# Patient Record
Sex: Female | Born: 1985 | Race: Black or African American | Hispanic: No | Marital: Single | Smoking: Never smoker
Health system: Southern US, Community
[De-identification: ages and names within clinical notes are randomized; demographics above are authoritative.]

## PROBLEM LIST (undated history)

## (undated) ENCOUNTER — Emergency Department (HOSPITAL_COMMUNITY): Admission: EM | Payer: No Typology Code available for payment source | Source: Home / Self Care

## (undated) DIAGNOSIS — T7840XA Allergy, unspecified, initial encounter: Secondary | ICD-10-CM

## (undated) DIAGNOSIS — E119 Type 2 diabetes mellitus without complications: Secondary | ICD-10-CM

## (undated) DIAGNOSIS — I10 Essential (primary) hypertension: Secondary | ICD-10-CM

## (undated) DIAGNOSIS — J45909 Unspecified asthma, uncomplicated: Secondary | ICD-10-CM

---

## 2014-08-08 ENCOUNTER — Emergency Department (HOSPITAL_COMMUNITY): Payer: Medicare (Managed Care)

## 2014-08-08 ENCOUNTER — Encounter (HOSPITAL_COMMUNITY): Payer: Self-pay | Admitting: Radiology

## 2014-08-08 ENCOUNTER — Inpatient Hospital Stay (HOSPITAL_COMMUNITY)
Admission: EM | Admit: 2014-08-08 | Discharge: 2014-08-15 | DRG: 552 | Disposition: A | Discharge status: Home / Self Care | Payer: Medicare (Managed Care) | Attending: General Surgery | Admitting: General Surgery

## 2014-08-08 DIAGNOSIS — S12110A Anterior displaced Type II dens fracture, initial encounter for closed fracture: Principal | ICD-10-CM | POA: Diagnosis present

## 2014-08-08 DIAGNOSIS — S129XXA Fracture of neck, unspecified, initial encounter: Secondary | ICD-10-CM

## 2014-08-08 DIAGNOSIS — S12491A Other nondisplaced fracture of fifth cervical vertebra, initial encounter for closed fracture: Secondary | ICD-10-CM | POA: Diagnosis present

## 2014-08-08 DIAGNOSIS — S12100A Unspecified displaced fracture of second cervical vertebra, initial encounter for closed fracture: Secondary | ICD-10-CM

## 2014-08-08 DIAGNOSIS — F10120 Alcohol abuse with intoxication, uncomplicated: Secondary | ICD-10-CM | POA: Diagnosis present

## 2014-08-08 DIAGNOSIS — E876 Hypokalemia: Secondary | ICD-10-CM | POA: Diagnosis present

## 2014-08-08 DIAGNOSIS — S12291A Other nondisplaced fracture of third cervical vertebra, initial encounter for closed fracture: Secondary | ICD-10-CM | POA: Diagnosis present

## 2014-08-08 DIAGNOSIS — S12490A Other displaced fracture of fifth cervical vertebra, initial encounter for closed fracture: Secondary | ICD-10-CM | POA: Diagnosis present

## 2014-08-08 DIAGNOSIS — M62838 Other muscle spasm: Secondary | ICD-10-CM | POA: Diagnosis not present

## 2014-08-08 DIAGNOSIS — Y908 Blood alcohol level of 240 mg/100 ml or more: Secondary | ICD-10-CM | POA: Diagnosis present

## 2014-08-08 DIAGNOSIS — R1110 Vomiting, unspecified: Secondary | ICD-10-CM

## 2014-08-08 DIAGNOSIS — R112 Nausea with vomiting, unspecified: Secondary | ICD-10-CM | POA: Diagnosis not present

## 2014-08-08 DIAGNOSIS — F10929 Alcohol use, unspecified with intoxication, unspecified: Secondary | ICD-10-CM | POA: Diagnosis present

## 2014-08-08 HISTORY — DX: Essential (primary) hypertension: I10

## 2014-08-08 HISTORY — DX: Unspecified asthma, uncomplicated: J45.909

## 2014-08-08 LAB — TYPE AND SCREEN
ABO/RH(D): O POS
Antibody Screen: NEGATIVE
Unit division: 0
Unit division: 0

## 2014-08-08 LAB — CBC
HEMATOCRIT: 39.1 % (ref 36.0–46.0)
HEMOGLOBIN: 13.6 g/dL (ref 12.0–15.0)
MCH: 30.8 pg (ref 26.0–34.0)
MCHC: 34.8 g/dL (ref 30.0–36.0)
MCV: 88.7 fL (ref 78.0–100.0)
Platelets: 267 10*3/uL (ref 150–400)
RBC: 4.41 MIL/uL (ref 3.87–5.11)
RDW: 12.8 % (ref 11.5–15.5)
WBC: 12.8 10*3/uL — AB (ref 4.0–10.5)

## 2014-08-08 LAB — PROTIME-INR
INR: 1.12 (ref 0.00–1.49)
Prothrombin Time: 14.5 seconds (ref 11.6–15.2)

## 2014-08-08 LAB — CDS SEROLOGY

## 2014-08-08 MED ORDER — ONDANSETRON HCL 4 MG/2ML IJ SOLN
4.0000 mg | Freq: Once | INTRAMUSCULAR | Status: AC
  Administered 2014-08-08: 4 mg via INTRAVENOUS

## 2014-08-08 NOTE — ED Notes (Signed)
Patient with episode of vomiting x 1

## 2014-08-08 NOTE — ED Notes (Signed)
Patient attempting to get out of bed. Patient is crying and appears uncomfortable. She is not speaking with us but is moving all extremities.
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
## 2014-08-08 NOTE — ED Notes (Signed)
Patient to CT with RN

## 2014-08-08 NOTE — ED Notes (Signed)
Patient stating she needs to pee. Patient placed on bed. Will transport to CT after bedpan usage.

## 2014-08-09 ENCOUNTER — Encounter (HOSPITAL_COMMUNITY): Payer: Self-pay | Admitting: Surgery

## 2014-08-09 DIAGNOSIS — F10129 Alcohol abuse with intoxication, unspecified: Secondary | ICD-10-CM | POA: Diagnosis not present

## 2014-08-09 DIAGNOSIS — S12201A Unspecified nondisplaced fracture of third cervical vertebra, initial encounter for closed fracture: Secondary | ICD-10-CM | POA: Diagnosis not present

## 2014-08-09 DIAGNOSIS — F10120 Alcohol abuse with intoxication, uncomplicated: Secondary | ICD-10-CM | POA: Diagnosis present

## 2014-08-09 DIAGNOSIS — Y92488 Other paved roadways as the place of occurrence of the external cause: Secondary | ICD-10-CM | POA: Diagnosis not present

## 2014-08-09 DIAGNOSIS — S12110A Anterior displaced Type II dens fracture, initial encounter for closed fracture: Secondary | ICD-10-CM | POA: Diagnosis present

## 2014-08-09 DIAGNOSIS — S12291A Other nondisplaced fracture of third cervical vertebra, initial encounter for closed fracture: Secondary | ICD-10-CM | POA: Diagnosis present

## 2014-08-09 DIAGNOSIS — R112 Nausea with vomiting, unspecified: Secondary | ICD-10-CM | POA: Diagnosis not present

## 2014-08-09 DIAGNOSIS — Y998 Other external cause status: Secondary | ICD-10-CM | POA: Diagnosis not present

## 2014-08-09 DIAGNOSIS — F10929 Alcohol use, unspecified with intoxication, unspecified: Secondary | ICD-10-CM | POA: Diagnosis present

## 2014-08-09 DIAGNOSIS — M542 Cervicalgia: Secondary | ICD-10-CM | POA: Diagnosis present

## 2014-08-09 DIAGNOSIS — Y9389 Activity, other specified: Secondary | ICD-10-CM | POA: Diagnosis not present

## 2014-08-09 DIAGNOSIS — M62838 Other muscle spasm: Secondary | ICD-10-CM | POA: Diagnosis not present

## 2014-08-09 DIAGNOSIS — E876 Hypokalemia: Secondary | ICD-10-CM | POA: Diagnosis present

## 2014-08-09 DIAGNOSIS — S12491A Other nondisplaced fracture of fifth cervical vertebra, initial encounter for closed fracture: Secondary | ICD-10-CM | POA: Diagnosis present

## 2014-08-09 DIAGNOSIS — S12401A Unspecified nondisplaced fracture of fifth cervical vertebra, initial encounter for closed fracture: Secondary | ICD-10-CM | POA: Diagnosis not present

## 2014-08-09 DIAGNOSIS — K5900 Constipation, unspecified: Secondary | ICD-10-CM | POA: Diagnosis not present

## 2014-08-09 DIAGNOSIS — Z880 Allergy status to penicillin: Secondary | ICD-10-CM | POA: Diagnosis not present

## 2014-08-09 DIAGNOSIS — Z91018 Allergy to other foods: Secondary | ICD-10-CM | POA: Diagnosis not present

## 2014-08-09 DIAGNOSIS — Y908 Blood alcohol level of 240 mg/100 ml or more: Secondary | ICD-10-CM | POA: Diagnosis present

## 2014-08-09 LAB — BASIC METABOLIC PANEL
ANION GAP: 13 (ref 5–15)
BUN: 6 mg/dL (ref 6–23)
CHLORIDE: 106 mmol/L (ref 96–112)
CO2: 19 mmol/L (ref 19–32)
CREATININE: 0.77 mg/dL (ref 0.50–1.10)
Calcium: 9 mg/dL (ref 8.4–10.5)
GFR calc Af Amer: >90 mL/min (ref >90)
GLUCOSE: 97 mg/dL (ref 70–99)
POTASSIUM: 3.3 mmol/L — AB (ref 3.5–5.1)
Sodium: 138 mmol/L (ref 135–145)

## 2014-08-09 LAB — COMPREHENSIVE METABOLIC PANEL
ALK PHOS: 53 U/L (ref 39–117)
ALT: 21 U/L (ref 0–35)
ANION GAP: 12 (ref 5–15)
AST: 34 U/L (ref 0–37)
Albumin: 4.5 g/dL (ref 3.5–5.2)
BUN: 8 mg/dL (ref 6–23)
CALCIUM: 9.1 mg/dL (ref 8.4–10.5)
CHLORIDE: 105 mmol/L (ref 96–112)
CO2: 21 mmol/L (ref 19–32)
CREATININE: 0.99 mg/dL (ref 0.50–1.10)
GFR calc non Af Amer: 77 mL/min — ABNORMAL LOW (ref >90)
GFR, EST AFRICAN AMERICAN: 90 mL/min — AB (ref >90)
GLUCOSE: 116 mg/dL — AB (ref 70–99)
POTASSIUM: 2.7 mmol/L — AB (ref 3.5–5.1)
SODIUM: 138 mmol/L (ref 135–145)
TOTAL PROTEIN: 7.5 g/dL (ref 6.0–8.3)
Total Bilirubin: 0.7 mg/dL (ref 0.3–1.2)

## 2014-08-09 LAB — PREPARE FRESH FROZEN PLASMA
UNIT DIVISION: 0
Unit division: 0

## 2014-08-09 LAB — CBC
HCT: 38.1 % (ref 36.0–46.0)
Hemoglobin: 13.2 g/dL (ref 12.0–15.0)
MCH: 30.6 pg (ref 26.0–34.0)
MCHC: 34.6 g/dL (ref 30.0–36.0)
MCV: 88.4 fL (ref 78.0–100.0)
PLATELETS: 236 10*3/uL (ref 150–400)
RBC: 4.31 MIL/uL (ref 3.87–5.11)
RDW: 12.8 % (ref 11.5–15.5)
WBC: 11.8 10*3/uL — ABNORMAL HIGH (ref 4.0–10.5)

## 2014-08-09 LAB — ETHANOL: Alcohol, Ethyl (B): 243 mg/dL — ABNORMAL HIGH (ref 0–9)

## 2014-08-09 LAB — MRSA PCR SCREENING: MRSA by PCR: NEGATIVE

## 2014-08-09 LAB — ABO/RH: ABO/RH(D): O POS

## 2014-08-09 MED ORDER — MORPHINE SULFATE 2 MG/ML IJ SOLN
1.0000 mg | INTRAMUSCULAR | Status: DC | PRN
  Administered 2014-08-09: 4 mg via INTRAVENOUS
  Administered 2014-08-09 (×3): 2 mg via INTRAVENOUS
  Administered 2014-08-09: 4 mg via INTRAVENOUS
  Administered 2014-08-09: 2 mg via INTRAVENOUS
  Administered 2014-08-10 (×2): 4 mg via INTRAVENOUS
  Filled 2014-08-09: qty 1
  Filled 2014-08-09 (×2): qty 2
  Filled 2014-08-09 (×2): qty 1
  Filled 2014-08-09 (×2): qty 2
  Filled 2014-08-09 (×2): qty 1

## 2014-08-09 MED ORDER — ONDANSETRON HCL 4 MG/2ML IJ SOLN
4.0000 mg | Freq: Four times a day (QID) | INTRAMUSCULAR | Status: DC | PRN
  Administered 2014-08-12 – 2014-08-14 (×5): 4 mg via INTRAVENOUS
  Filled 2014-08-09 (×6): qty 2

## 2014-08-09 MED ORDER — PANTOPRAZOLE SODIUM 40 MG IV SOLR
40.0000 mg | Freq: Every day | INTRAVENOUS | Status: DC
  Administered 2014-08-09 (×2): 40 mg via INTRAVENOUS
  Filled 2014-08-09 (×3): qty 40

## 2014-08-09 MED ORDER — FOLIC ACID 1 MG PO TABS
1.0000 mg | ORAL_TABLET | Freq: Every day | ORAL | Status: DC
  Administered 2014-08-10 – 2014-08-15 (×6): 1 mg via ORAL
  Filled 2014-08-09 (×7): qty 1

## 2014-08-09 MED ORDER — ADULT MULTIVITAMIN W/MINERALS CH
1.0000 | ORAL_TABLET | Freq: Every day | ORAL | Status: DC
  Administered 2014-08-10 – 2014-08-15 (×6): 1 via ORAL
  Filled 2014-08-09 (×7): qty 1

## 2014-08-09 MED ORDER — LORAZEPAM 1 MG PO TABS
0.0000 mg | ORAL_TABLET | Freq: Two times a day (BID) | ORAL | Status: AC
  Administered 2014-08-11: 4 mg via ORAL
  Administered 2014-08-12: 3 mg via ORAL
  Administered 2014-08-12: 4 mg via ORAL
  Administered 2014-08-12: 1 mg via ORAL
  Filled 2014-08-09: qty 4
  Filled 2014-08-09: qty 3
  Filled 2014-08-09: qty 1
  Filled 2014-08-09: qty 4
  Filled 2014-08-09: qty 1
  Filled 2014-08-09: qty 4

## 2014-08-09 MED ORDER — ALBUTEROL SULFATE (2.5 MG/3ML) 0.083% IN NEBU: 3.0000 mL | INHALATION_SOLUTION | Freq: Four times a day (QID) | RESPIRATORY_TRACT | Status: DC | PRN

## 2014-08-09 MED ORDER — LORAZEPAM 1 MG PO TABS
0.0000 mg | ORAL_TABLET | Freq: Four times a day (QID) | ORAL | Status: AC
  Administered 2014-08-10 (×2): 1 mg via ORAL
  Filled 2014-08-09 (×4): qty 1

## 2014-08-09 MED ORDER — LORAZEPAM 1 MG PO TABS
1.0000 mg | ORAL_TABLET | Freq: Four times a day (QID) | ORAL | Status: AC | PRN
  Administered 2014-08-09 – 2014-08-12 (×6): 1 mg via ORAL
  Filled 2014-08-09 (×2): qty 1

## 2014-08-09 MED ORDER — VITAMIN B-1 100 MG PO TABS
100.0000 mg | ORAL_TABLET | Freq: Every day | ORAL | Status: DC
  Administered 2014-08-10 – 2014-08-15 (×6): 100 mg via ORAL
  Filled 2014-08-09 (×7): qty 1

## 2014-08-09 MED ORDER — OXYCODONE HCL 5 MG PO TABS
5.0000 mg | ORAL_TABLET | ORAL | Status: DC | PRN
  Administered 2014-08-09: 5 mg via ORAL
  Administered 2014-08-09 – 2014-08-10 (×3): 10 mg via ORAL
  Filled 2014-08-09 (×3): qty 2
  Filled 2014-08-09: qty 1

## 2014-08-09 MED ORDER — THIAMINE HCL 100 MG/ML IJ SOLN
100.0000 mg | Freq: Every day | INTRAMUSCULAR | Status: DC
  Filled 2014-08-09: qty 1

## 2014-08-09 MED ORDER — IOHEXOL 300 MG/ML  SOLN
100.0000 mL | Freq: Once | INTRAMUSCULAR | Status: AC | PRN
  Administered 2014-08-09: 100 mL via INTRAVENOUS

## 2014-08-09 MED ORDER — TIZANIDINE HCL 2 MG PO TABS
4.0000 mg | ORAL_TABLET | Freq: Three times a day (TID) | ORAL | Status: DC | PRN
Start: 2014-08-09 — End: 2014-08-12
  Administered 2014-08-09 – 2014-08-11 (×5): 4 mg via ORAL
  Filled 2014-08-09 (×2): qty 2
  Filled 2014-08-09: qty 1
  Filled 2014-08-09 (×2): qty 2
  Filled 2014-08-09: qty 1

## 2014-08-09 MED ORDER — FENTANYL CITRATE 0.05 MG/ML IJ SOLN
50.0000 ug | Freq: Once | INTRAMUSCULAR | Status: AC
  Administered 2014-08-09: 50 ug via INTRAVENOUS
  Filled 2014-08-09: qty 2

## 2014-08-09 MED ORDER — LORAZEPAM 2 MG/ML IJ SOLN: 1.0000 mg | Freq: Four times a day (QID) | INTRAMUSCULAR | Status: AC | PRN

## 2014-08-09 MED ORDER — ONDANSETRON HCL 4 MG/2ML IJ SOLN
4.0000 mg | Freq: Once | INTRAMUSCULAR | Status: AC
  Administered 2014-08-09: 4 mg via INTRAVENOUS
  Filled 2014-08-09: qty 2

## 2014-08-09 MED ORDER — KCL IN DEXTROSE-NACL 20-5-0.45 MEQ/L-%-% IV SOLN
INTRAVENOUS | Status: DC
  Administered 2014-08-09: 02:00:00 via INTRAVENOUS
  Filled 2014-08-09 (×5): qty 1000

## 2014-08-09 MED ORDER — PROMETHAZINE HCL 25 MG/ML IJ SOLN
12.5000 mg | INTRAMUSCULAR | Status: DC | PRN
  Administered 2014-08-09 – 2014-08-15 (×5): 12.5 mg via INTRAVENOUS
  Filled 2014-08-09 (×5): qty 1

## 2014-08-09 NOTE — Progress Notes (Signed)
Patient ID: Traci MinorsKeyoka Towry, female   DOB: 08/21/1985, 29 y.o.   MRN: [id number] Note dictated. Fracture of C2 type 2 with no displacement. Fracture of transverse process and tru foramen below. No displacement. Neuro oriented. He will need the hard collar 24/7 for at least 6 to 8 weeks. Will follow

## 2014-08-09 NOTE — ED Notes (Signed)
Patient transported to CT 
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
## 2014-08-09 NOTE — Progress Notes (Signed)
Patient ID: Traci Novak, female   DOB: 11/30/1985, 29 y.o.   MRN: [id number]  Pt now awake and follow commands, moving all 4 extremities  CT head, chest, abd, pelvis negative for acute injury  CT c-spine shows type 2 dens fracture, minimally displaced and left C3 transverse process fracture and a left C5 pedicle fracture  Will consult neurosurgery
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
## 2014-08-09 NOTE — Progress Notes (Signed)
Patient becoming combative and verbally abusive towards staff. Complaining she cannot go to the bathroom despite having foley catheter. Foley contained 300cc clear, yellow urine. Patient bladder scanned and found to have 34cc retained. Patient assured by RN and other staff that foley was draining bladder. Patient threatened staff that she would "pull it out" and "leave the hospital." RN removed foley catheter to calm patient. Patient was placed on the bedpan. Voided 10cc of urine, stated that she "was relieved." Will continue to monitor.   Rochele PagesHancock, Woodward Klem A, RN
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
## 2014-08-09 NOTE — Progress Notes (Signed)
UR completed 

## 2014-08-10 DIAGNOSIS — S12490A Other displaced fracture of fifth cervical vertebra, initial encounter for closed fracture: Secondary | ICD-10-CM | POA: Diagnosis present

## 2014-08-10 DIAGNOSIS — S12100A Unspecified displaced fracture of second cervical vertebra, initial encounter for closed fracture: Secondary | ICD-10-CM | POA: Diagnosis present

## 2014-08-10 DIAGNOSIS — S129XXA Fracture of neck, unspecified, initial encounter: Secondary | ICD-10-CM | POA: Diagnosis present

## 2014-08-10 MED ORDER — NAPROXEN 250 MG PO TABS
500.0000 mg | ORAL_TABLET | Freq: Two times a day (BID) | ORAL | Status: DC
  Administered 2014-08-10 – 2014-08-13 (×4): 500 mg via ORAL
  Filled 2014-08-10 (×6): qty 2

## 2014-08-10 MED ORDER — OXYCODONE HCL 5 MG PO TABS
10.0000 mg | ORAL_TABLET | Freq: Once | ORAL | Status: AC
  Administered 2014-08-10: 10 mg via ORAL
  Filled 2014-08-10: qty 2

## 2014-08-10 MED ORDER — MORPHINE SULFATE 2 MG/ML IJ SOLN
2.0000 mg | INTRAMUSCULAR | Status: DC | PRN
  Administered 2014-08-11 – 2014-08-14 (×4): 2 mg via INTRAVENOUS
  Filled 2014-08-10 (×4): qty 1

## 2014-08-10 MED ORDER — OXYCODONE HCL 5 MG PO TABS
10.0000 mg | ORAL_TABLET | ORAL | Status: DC | PRN
  Administered 2014-08-10: 10 mg via ORAL
  Administered 2014-08-10: 20 mg via ORAL
  Administered 2014-08-10: 10 mg via ORAL
  Administered 2014-08-11 – 2014-08-13 (×11): 20 mg via ORAL
  Administered 2014-08-13: 10 mg via ORAL
  Administered 2014-08-14 (×2): 20 mg via ORAL
  Administered 2014-08-15: 15 mg via ORAL
  Administered 2014-08-15: 20 mg via ORAL
  Filled 2014-08-10 (×8): qty 4
  Filled 2014-08-10: qty 2
  Filled 2014-08-10 (×2): qty 4
  Filled 2014-08-10 (×2): qty 2
  Filled 2014-08-10 (×6): qty 4

## 2014-08-10 NOTE — Progress Notes (Signed)
0600 Patient was ask if she needed to go the bathroom and she stated no. I explain that she had not voided in about 12hrs. Patient stated she knew when she had to go and would call. Patient refuses for us do any scan or cath.
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
## 2014-08-10 NOTE — Progress Notes (Signed)
PT Cancellation Note  Patient Details Name: Traci Novak MRN: [id number] DOB: 03/25/1986   Cancelled Treatment:    Reason Eval/Treat Not Completed: Other (comment). Patient extremely frustrated at this time. Stating that shes upset she can't get out of bed on her own and is in a ton of pain. Agreeable to therapy later this afternoon. Will follow up./    Traci Novak, Traci Novak 08/10/2014, 10:52 AM

## 2014-08-10 NOTE — Progress Notes (Signed)
Patient ID: Traci Novak Perfect, female   DOB: 09/11/1985, 29 y.o.   MRN: [id number] Neuro stable. No weaknss. C/o pain all over. Continue with brace

## 2014-08-11 ENCOUNTER — Inpatient Hospital Stay (HOSPITAL_COMMUNITY): Payer: Medicare (Managed Care)

## 2014-08-11 MED ORDER — WHITE PETROLATUM GEL
Status: AC
  Administered 2014-08-11: 07:00:00
  Filled 2014-08-11: qty 1

## 2014-08-11 MED ORDER — DOCUSATE SODIUM 100 MG PO CAPS
100.0000 mg | ORAL_CAPSULE | Freq: Two times a day (BID) | ORAL | Status: DC
  Administered 2014-08-11 – 2014-08-15 (×7): 100 mg via ORAL
  Filled 2014-08-11 (×8): qty 1

## 2014-08-11 MED ORDER — TRAMADOL HCL 50 MG PO TABS
100.0000 mg | ORAL_TABLET | Freq: Four times a day (QID) | ORAL | Status: DC
  Administered 2014-08-11 – 2014-08-15 (×17): 100 mg via ORAL
  Filled 2014-08-11 (×17): qty 2

## 2014-08-11 MED ORDER — POLYETHYLENE GLYCOL 3350 17 G PO PACK
17.0000 g | PACK | Freq: Every day | ORAL | Status: DC
  Administered 2014-08-11 – 2014-08-13 (×3): 17 g via ORAL
  Filled 2014-08-11 (×3): qty 1
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
## 2014-08-11 NOTE — Progress Notes (Signed)
Patient ID: Traci Novak, female   DOB: 11/13/1985, 29 y.o.   MRN: [id number] C/o pain in shoulders and neck. Neuro stable. To get a c-spine ray

## 2014-08-12 ENCOUNTER — Telehealth: Payer: Self-pay | Admitting: *Deleted

## 2014-08-12 LAB — BLOOD PRODUCT ORDER (VERBAL) VERIFICATION

## 2014-08-12 MED ORDER — MORPHINE SULFATE ER 15 MG PO TBCR
15.0000 mg | EXTENDED_RELEASE_TABLET | Freq: Two times a day (BID) | ORAL | Status: DC
  Administered 2014-08-12 – 2014-08-13 (×4): 15 mg via ORAL
  Filled 2014-08-12 (×4): qty 1

## 2014-08-12 MED ORDER — METHOCARBAMOL 750 MG PO TABS
1500.0000 mg | ORAL_TABLET | Freq: Four times a day (QID) | ORAL | Status: DC
  Administered 2014-08-12 – 2014-08-15 (×11): 1500 mg via ORAL
  Filled 2014-08-12 (×13): qty 2
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
## 2014-08-12 NOTE — Progress Notes (Signed)
Patient ID: [name], female   DOB: 07/17/1985, 29 y.o.   MRN: [id number] Neck pain, no weakness. c spine shows good aligment with increase of prevertebral swelling. Aware about the need to wear the collar 24/7 for 6 to 8 weeks
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
## 2014-08-12 NOTE — Clinical Social Work Note (Signed)
CSW attempted to assess patient and complete SBIRT. Patient has been non-cooperative and has tried to leave AMA. RN advises against meeting with patient today. CSW will attempt to reassess at a later time.    Roddie McBryant Aliyana Dlugosz MSW, [place]CSWA, [place], [phone]
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
## 2014-08-12 NOTE — Care Management Note (Signed)
  Page 1 of 1   08/12/2014     2:42:50 PM CARE MANAGEMENT NOTE 08/12/2014  Patient:  Traci Novak,Traci   Account Number:  [id number]  Date Initiated:  08/12/2014  Documentation initiated by:  Ronny FlurryWILE,Courtland Coppa  Subjective/Objective Assessment:     Action/Plan:   Anticipated DC Date:     Anticipated DC Plan:           Choice offered to / List presented to:             Status of service:   Medicare Important Message given?   (If response is "NO", the following Medicare IM given date fields will be blank) Date Medicare IM given:   Medicare IM given by:   Date Additional Medicare IM given:   Additional Medicare IM given by:    Discharge Disposition:    Per UR Regulation:    If discussed at Long Length of Stay Meetings, dates discussed:    Comments:  08-12-14 Received voice mail from Case Manager [facility] [phone] ext [phone] . Returned call left message . Spoke   to patient she does have insurance . Called admiting and faxed copy of front and back insurance card to admitting . returned insurance card to patient . Ronny FlurryHeather Jaiyon Wander RN BSN

## 2014-08-13 MED ORDER — SODIUM CHLORIDE 0.9 % IV SOLN
INTRAVENOUS | Status: DC
  Administered 2014-08-13 – 2014-08-14 (×3): via INTRAVENOUS
  Filled 2014-08-13 (×5): qty 1000

## 2014-08-13 MED ORDER — POTASSIUM CHLORIDE 10 MEQ/100ML IV SOLN
10.0000 meq | INTRAVENOUS | Status: AC
  Administered 2014-08-13 (×2): 10 meq via INTRAVENOUS
  Filled 2014-08-13 (×2): qty 100

## 2014-08-13 MED ORDER — SODIUM CHLORIDE 0.9 % IV SOLN
INTRAVENOUS | Status: AC
  Administered 2014-08-13: 07:00:00 via INTRAVENOUS

## 2014-08-13 MED ORDER — POLYETHYLENE GLYCOL 3350 17 G PO PACK
17.0000 g | PACK | Freq: Three times a day (TID) | ORAL | Status: DC
  Administered 2014-08-14 (×2): 17 g via ORAL
  Filled 2014-08-13 (×5): qty 1

## 2014-08-13 MED ORDER — HALOPERIDOL LACTATE 5 MG/ML IJ SOLN: 5.0000 mg | Freq: Once | INTRAMUSCULAR | Status: DC

## 2014-08-13 NOTE — Progress Notes (Signed)
Patient ID: Traci Novak, female   DOB: 03/02/1986, 29 y.o.   MRN: [id number] Stable . C/o neck pain. Advised about keeping collar 24/7
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
## 2014-08-13 NOTE — Progress Notes (Signed)
Patient very uncooperative and agitated.  She removed [employer] Lifespen Collar, wants to leave and go outside because"my friend upset me", refused Potassium IV .  Was able to put collar back after 10 mins.  Given PRN meds for nausea and pain.  MD notified.  Will discontinue K runs. Also patient requested to have her Depo provera shot for this month ( confirmed with [facility]  Phone no. [phone] Ext [phone] her dose and the last administration May 24, 2014) MD also notified.
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
## 2014-08-14 LAB — BASIC METABOLIC PANEL
Anion gap: 8 (ref 5–15)
BUN: 8 mg/dL (ref 6–23)
CO2: 27 mmol/L (ref 19–32)
Calcium: 9.5 mg/dL (ref 8.4–10.5)
Chloride: 102 mmol/L (ref 96–112)
Creatinine, Ser: 0.93 mg/dL (ref 0.50–1.10)
GFR calc Af Amer: >90 mL/min (ref >90)
GFR, EST NON AFRICAN AMERICAN: 83 mL/min — AB (ref >90)
Glucose, Bld: 68 mg/dL — ABNORMAL LOW (ref 70–99)
Potassium: 4.2 mmol/L (ref 3.5–5.1)
Sodium: 137 mmol/L (ref 135–145)

## 2014-08-14 MED ORDER — MEDROXYPROGESTERONE ACETATE 150 MG/ML IM SUSP
150.0000 mg | Freq: Once | INTRAMUSCULAR | Status: AC
  Administered 2014-08-14: 150 mg via INTRAMUSCULAR
  Filled 2014-08-14: qty 1

## 2014-08-14 MED ORDER — LORAZEPAM 2 MG/ML IJ SOLN
2.0000 mg | Freq: Four times a day (QID) | INTRAMUSCULAR | Status: DC | PRN
  Administered 2014-08-15: 2 mg via INTRAVENOUS
  Filled 2014-08-14: qty 1

## 2014-08-14 MED ORDER — FENTANYL 50 MCG/HR TD PT72
75.0000 ug | MEDICATED_PATCH | TRANSDERMAL | Status: DC
  Administered 2014-08-14: 75 ug via TRANSDERMAL
  Filled 2014-08-14 (×2): qty 1

## 2014-08-14 MED ORDER — FENTANYL CITRATE 0.05 MG/ML IJ SOLN
50.0000 ug | INTRAMUSCULAR | Status: DC | PRN
  Administered 2014-08-14 – 2014-08-15 (×6): 50 ug via INTRAVENOUS
  Filled 2014-08-14 (×7): qty 2

## 2014-08-14 NOTE — Progress Notes (Signed)
Patient out in hall yelling and complaining that she has been nauseated and vomiting since she's been here and she wants it to stop.  Pt says emesis is green and she states she is not passing gas. Refusing Zofran and Phenergan at this time; states she wants the MD called .  Patient agitated and says the doctor needs to make a plan to stop the vomiting.  Dr. Janee Mornhompson notified.  Will continue to encourage patient to take the nausea meds.

## 2014-08-14 NOTE — Progress Notes (Signed)
Told patient that Dr. Janee Mornhompson advised she take the nausea medications ordered.  Patient wants doctor to call her phone.  Says she's been taking medicine and nothing's worked and she still hasn't had a bowel movement.Wants to know why we are giving her all this medicine on an empty stomach.  Both Zofran and Phenergan are ordered IV; I informed the patient of this and told her this is the treatment we have for nausea.  She said, "well give me whatever the doctor said to give me!  I told her that the doctor advised she take the phenergan. She yelled,"Phenergan is for my pain! Ya'll just want to knock me out."  I told her that phenergan was for nausea.  She ran out of the room and said, "I want my nurse changed." Charge nurse informed.
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
## 2014-08-14 NOTE — Progress Notes (Signed)
Patient ID: Traci MinorsKeyoka Zeisler, female   DOB: 10/03/1985, 29 y.o.   MRN: [id number] Neuro stable. Refusing to wear collar. Advised about the need to wear it 24/7 to to allow fracture to heal WITHOUT surgery. Will f/u prn

## 2014-08-14 NOTE — Progress Notes (Deleted)
Patient out in hall yelling that she has been na

## 2014-08-15 ENCOUNTER — Encounter (HOSPITAL_COMMUNITY): Payer: Self-pay | Admitting: Emergency Medicine

## 2014-08-15 ENCOUNTER — Inpatient Hospital Stay (HOSPITAL_COMMUNITY): Payer: Medicare (Managed Care)

## 2014-08-15 ENCOUNTER — Observation Stay (HOSPITAL_COMMUNITY)
Admission: EM | Admit: 2014-08-15 | Discharge: 2014-08-16 | Disposition: A | Discharge status: Home / Self Care | Payer: Medicaid - Out of State | Attending: Internal Medicine | Admitting: Internal Medicine

## 2014-08-15 DIAGNOSIS — F10129 Alcohol abuse with intoxication, unspecified: Secondary | ICD-10-CM | POA: Insufficient documentation

## 2014-08-15 DIAGNOSIS — R52 Pain, unspecified: Secondary | ICD-10-CM | POA: Diagnosis present

## 2014-08-15 DIAGNOSIS — Z91018 Allergy to other foods: Secondary | ICD-10-CM | POA: Insufficient documentation

## 2014-08-15 DIAGNOSIS — S12201A Unspecified nondisplaced fracture of third cervical vertebra, initial encounter for closed fracture: Principal | ICD-10-CM | POA: Insufficient documentation

## 2014-08-15 DIAGNOSIS — K5900 Constipation, unspecified: Secondary | ICD-10-CM | POA: Insufficient documentation

## 2014-08-15 DIAGNOSIS — Y92488 Other paved roadways as the place of occurrence of the external cause: Secondary | ICD-10-CM | POA: Insufficient documentation

## 2014-08-15 DIAGNOSIS — Z880 Allergy status to penicillin: Secondary | ICD-10-CM | POA: Insufficient documentation

## 2014-08-15 DIAGNOSIS — S12110A Anterior displaced Type II dens fracture, initial encounter for closed fracture: Secondary | ICD-10-CM | POA: Insufficient documentation

## 2014-08-15 DIAGNOSIS — Y9389 Activity, other specified: Secondary | ICD-10-CM | POA: Insufficient documentation

## 2014-08-15 DIAGNOSIS — S129XXA Fracture of neck, unspecified, initial encounter: Secondary | ICD-10-CM | POA: Diagnosis present

## 2014-08-15 DIAGNOSIS — Y998 Other external cause status: Secondary | ICD-10-CM | POA: Insufficient documentation

## 2014-08-15 DIAGNOSIS — M542 Cervicalgia: Secondary | ICD-10-CM

## 2014-08-15 DIAGNOSIS — S12401A Unspecified nondisplaced fracture of fifth cervical vertebra, initial encounter for closed fracture: Secondary | ICD-10-CM | POA: Insufficient documentation

## 2014-08-15 MED ORDER — ONDANSETRON HCL 4 MG PO TABS
4.0000 mg | ORAL_TABLET | Freq: Three times a day (TID) | ORAL | Status: DC
  Filled 2014-08-15: qty 1

## 2014-08-15 MED ORDER — BISACODYL 10 MG RE SUPP
10.0000 mg | Freq: Once | RECTAL | Status: AC
  Administered 2014-08-15: 10 mg via RECTAL
  Filled 2014-08-15: qty 1

## 2014-08-15 MED ORDER — ONDANSETRON HCL 4 MG PO TABS: 4.0000 mg | ORAL_TABLET | Freq: Three times a day (TID) | ORAL | Status: DC | PRN

## 2014-08-15 MED ORDER — KETOROLAC TROMETHAMINE 30 MG/ML IJ SOLN
30.0000 mg | Freq: Once | INTRAMUSCULAR | Status: DC
  Filled 2014-08-15: qty 1

## 2014-08-15 MED ORDER — KETOROLAC TROMETHAMINE 15 MG/ML IJ SOLN: 15.0000 mg | Freq: Four times a day (QID) | INTRAMUSCULAR | Status: DC

## 2014-08-15 MED ORDER — ENOXAPARIN SODIUM 40 MG/0.4ML ~~LOC~~ SOLN
40.0000 mg | SUBCUTANEOUS | Status: DC
  Filled 2014-08-15: qty 0.4

## 2014-08-15 MED ORDER — TRAMADOL HCL 50 MG PO TABS: 100.0000 mg | ORAL_TABLET | Freq: Four times a day (QID) | ORAL | Status: DC

## 2014-08-15 MED ORDER — OXYCODONE-ACETAMINOPHEN 10-325 MG PO TABS: 1.0000 | ORAL_TABLET | ORAL | Status: DC | PRN

## 2014-08-15 MED ORDER — METHOCARBAMOL 500 MG PO TABS: 500.0000 mg | ORAL_TABLET | Freq: Four times a day (QID) | ORAL | Status: DC | PRN

## 2014-08-15 MED ORDER — OXYCODONE-ACETAMINOPHEN 5-325 MG PO TABS
2.0000 | ORAL_TABLET | ORAL | Status: DC | PRN
Start: 2014-08-15 — End: 2014-08-16

## 2014-08-15 MED ORDER — PROMETHAZINE HCL 25 MG PO TABS
12.5000 mg | ORAL_TABLET | Freq: Four times a day (QID) | ORAL | Status: DC | PRN
  Administered 2014-08-15: 25 mg via ORAL
  Filled 2014-08-15: qty 1

## 2014-08-15 MED ORDER — NAPROXEN 500 MG PO TABS: 500.0000 mg | ORAL_TABLET | Freq: Two times a day (BID) | ORAL | Status: DC
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
## 2014-08-15 NOTE — Progress Notes (Signed)
Patient has been discharged and refused to leave. Traci NeedleMichael, PA spoke with patient regarding discharge. Staff and the interdisciplinary team felt that patient was clear to be discharged based on patient status. PT cleared patient and had no recommendations for patient upon discharge. Patient had been getting up out of bed on her own walking in the hallways; also to the bathroom to bath self in sink. Patient has been disruptive to staff all day and complaining of still being in pain after pain medication was changed multiple times. Patient also had been complaining of nausea and vomiting stating that she hasn't been able to keep anything down since she's been here. Offered to give patient PO and IV nausea medication along with PO, IV pain med and patient agreed to it. Patient had loss of IV access and agreed to taking all medications at one time after new IV was restarted. Soon after, patient was told she was going to be discharged so IV was not restarted and patient did not receive scheduled Zofran. Patient became very upset and also refused pain medications at that time. Patient disagreed with orders stating that she wasn't going anywhere. Patient got furious with staff and asked to speak with the Director and attempted to call patient experience. Assistant Director Traci Novak tried to diffuse the situation with patient but patient kept getting louder and louder over talking using profanity towards staff. MD was notified. MD Lindie SpruceWyatt returned phone call and discontinued discharge orders for patient to stay another day. Patient felt like it was not worth it to stay one more day and decided to leave anyway. MD was notified. Patient left hospital shouting in hallway disrespecting staff using explicit and inappropriate language with also saying that everyone will be sorry that they discharged her. Security was called to escort patient for discharge.

## 2014-08-15 NOTE — Progress Notes (Signed)
PT Cancellation Note  Patient Details Name: Traci Novak MRN: [id number] DOB: 09/17/1985   Cancelled Treatment:    Reason Eval/Treat Not Completed: Other (comment) (pt actively being d/c from the hospital.  ).  IF she happens to still be here tomorrow PT to check back then.  Thanks,    Rollene Rotundaebecca B. Minnetta Sandora, PT, DPT [phone]   08/15/2014, 4:27 PM

## 2014-08-15 NOTE — Progress Notes (Signed)
Chart reviewed. CSW spoke with pt who does not believe that she should have been d/c'ed today in her condition.  She is unsure why she did not go to rehab before being d/c'ed today.  She describes extreme pain in her neck, along with a burning feeling that feels like someone is taking a "blowtorch" to her skin.  Pt also reports bloddy, watery stools today in ED.  Emotional support provided.  RNCM consulted and case discussed with PA.

## 2014-08-15 NOTE — Discharge Instructions (Signed)
Keep collar on at all times.  No driving while taking oxycodone.

## 2014-08-15 NOTE — Progress Notes (Signed)
Patient being discharge walked from 6N12 to the front desk verbally abusive , very mad and demanding to have a wheelchair to go home. Staff stated we could bring her down and she can ride in a wheelchair but she insist to have a wheelchair to go home with. Patient continously saying inappropriate words to staff, this writer called security but patient insisted to get a wheelchair Dan Humphreys/walker. This Clinical research associatewriter reviewed PT note and no equipment needed was documented for discharge. Explained to the patient , patient continously explicit inappropriate behavior, security escorted patient for discharge.
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
## 2014-08-15 NOTE — Progress Notes (Signed)
Pt been complaining of pain, nausea and vomiting through out the night; Pt was given IV phenergan for nausea and IV Fentanyl for pain; pt initially refusing any PO meds due to nausea; but would take tramadol for pain and would like to be waken up just for pain medication but not for any other scheduled meds. Pt gets agitated easily and offered Ativan but declined; Pt's VS stable; will continue to monitor pt.
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
## 2014-08-15 NOTE — Clinical Social Work Note (Signed)
Clinical Social Worker continuing to follow patient for support and discharge planning needs.  CSW witnessed patient get out of bed and walk across the hall to the RN to ask a question and back to bed.  CSW then spoke with patient in the room to discuss means of transportation at discharge.  Patient states that she will be able to find someone to transport her once ready for discharge.  Clinical Social Worker will sign off for now as social work intervention is no longer needed. Please consult us again if new need arises.  Macario GoldsJesse Allisyn Kunz, [place] [phone]
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
## 2014-08-16 DIAGNOSIS — R52 Pain, unspecified: Secondary | ICD-10-CM | POA: Diagnosis present

## 2014-08-16 DIAGNOSIS — M542 Cervicalgia: Secondary | ICD-10-CM

## 2014-08-16 DIAGNOSIS — S129XXA Fracture of neck, unspecified, initial encounter: Secondary | ICD-10-CM | POA: Diagnosis present

## 2014-08-16 DIAGNOSIS — S129XXD Fracture of neck, unspecified, subsequent encounter: Secondary | ICD-10-CM

## 2014-08-16 LAB — CBC WITH DIFFERENTIAL/PLATELET
Basophils Absolute: 0 10*3/uL (ref 0.0–0.1)
Basophils Relative: 0 % (ref 0–1)
Eosinophils Absolute: 0.2 10*3/uL (ref 0.0–0.7)
Eosinophils Relative: 2 % (ref 0–5)
HEMATOCRIT: 42.5 % (ref 36.0–46.0)
Hemoglobin: 14.2 g/dL (ref 12.0–15.0)
LYMPHS PCT: 28 % (ref 12–46)
Lymphs Abs: 2.5 10*3/uL (ref 0.7–4.0)
MCH: 29.7 pg (ref 26.0–34.0)
MCHC: 33.4 g/dL (ref 30.0–36.0)
MCV: 88.9 fL (ref 78.0–100.0)
MONO ABS: 0.6 10*3/uL (ref 0.1–1.0)
Monocytes Relative: 7 % (ref 3–12)
NEUTROS ABS: 5.7 10*3/uL (ref 1.7–7.7)
Neutrophils Relative %: 63 % (ref 43–77)
PLATELETS: 275 10*3/uL (ref 150–400)
RBC: 4.78 MIL/uL (ref 3.87–5.11)
RDW: 12.5 % (ref 11.5–15.5)
WBC: 9 10*3/uL (ref 4.0–10.5)

## 2014-08-16 LAB — RAPID URINE DRUG SCREEN, HOSP PERFORMED
Amphetamines: NOT DETECTED
Barbiturates: NOT DETECTED
Benzodiazepines: POSITIVE — AB
COCAINE: NOT DETECTED
Opiates: POSITIVE — AB
TETRAHYDROCANNABINOL: POSITIVE — AB

## 2014-08-16 LAB — COMPREHENSIVE METABOLIC PANEL
ALK PHOS: 52 U/L (ref 39–117)
ALT: 14 U/L (ref 0–35)
AST: 21 U/L (ref 0–37)
Albumin: 4.1 g/dL (ref 3.5–5.2)
Anion gap: 4 — ABNORMAL LOW (ref 5–15)
BILIRUBIN TOTAL: 0.5 mg/dL (ref 0.3–1.2)
BUN: 10 mg/dL (ref 6–23)
CHLORIDE: 104 mmol/L (ref 96–112)
CO2: 25 mmol/L (ref 19–32)
Calcium: 9.7 mg/dL (ref 8.4–10.5)
Creatinine, Ser: 0.97 mg/dL (ref 0.50–1.10)
GFR calc non Af Amer: 79 mL/min — ABNORMAL LOW (ref >90)
Glucose, Bld: 82 mg/dL (ref 70–99)
POTASSIUM: 4.4 mmol/L (ref 3.5–5.1)
Sodium: 133 mmol/L — ABNORMAL LOW (ref 135–145)
Total Protein: 6.9 g/dL (ref 6.0–8.3)

## 2014-08-16 LAB — ETHANOL: Alcohol, Ethyl (B): <5 mg/dL (ref 0–9)

## 2014-08-16 MED ORDER — METHOCARBAMOL 500 MG PO TABS: 500.0000 mg | ORAL_TABLET | Freq: Four times a day (QID) | ORAL | Status: DC | PRN

## 2014-08-16 MED ORDER — HEPARIN SODIUM (PORCINE) 5000 UNIT/ML IJ SOLN: 5000.0000 [IU] | Freq: Three times a day (TID) | INTRAMUSCULAR | Status: DC

## 2014-08-16 MED ORDER — HYDROCODONE-ACETAMINOPHEN 5-325 MG PO TABS
1.0000 | ORAL_TABLET | ORAL | Status: DC | PRN
  Administered 2014-08-16 (×3): 2 via ORAL
  Filled 2014-08-16 (×3): qty 2

## 2014-08-16 MED ORDER — SENNA 8.6 MG PO TABS
2.0000 | ORAL_TABLET | Freq: Every day | ORAL | Status: DC
Start: 2014-08-16 — End: 2014-08-16
  Administered 2014-08-16: 17.2 mg via ORAL
  Filled 2014-08-16: qty 2

## 2014-08-16 MED ORDER — METHOCARBAMOL 500 MG PO TABS
500.0000 mg | ORAL_TABLET | Freq: Four times a day (QID) | ORAL | Status: DC | PRN
  Administered 2014-08-16: 500 mg via ORAL
  Filled 2014-08-16: qty 1

## 2014-08-16 MED ORDER — POLYETHYLENE GLYCOL 3350 17 G PO PACK
17.0000 g | PACK | Freq: Two times a day (BID) | ORAL | Status: DC
  Administered 2014-08-16: 17 g via ORAL
  Filled 2014-08-16: qty 1

## 2014-08-16 MED ORDER — OXYCODONE-ACETAMINOPHEN 5-325 MG PO TABS: 2.0000 | ORAL_TABLET | ORAL | Status: DC | PRN

## 2014-08-16 MED ORDER — OXYCODONE-ACETAMINOPHEN 5-325 MG PO TABS
2.0000 | ORAL_TABLET | Freq: Once | ORAL | Status: AC
  Administered 2014-08-16: 2 via ORAL
  Filled 2014-08-16: qty 2
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
## 2014-08-16 NOTE — Progress Notes (Signed)
Patient up in room demanding in a firm agitated intense voice to get the "IV thing out of my arm, I'm taking a shower now"...while pacing the floor, only clothes on are her boxers. "I'm leaving here, take off these wrist things." Tech in room assisting. Attempted to explain to her SW is attempting to help with transportation and medications [Medicaid in [place] only, just recently moved to [place]]. "I don't want anything from anybody here at [facility], I'm leaving." Will notify MD of events. Will provide AMA form.

## 2014-08-16 NOTE — Progress Notes (Signed)
UR completed 

## 2014-08-16 NOTE — Clinical Social Work Note (Signed)
CSW met with patient at bedside with RNCM. 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 Patient currently with complaints of neck pain and inability to pay for pain medication at discharge. Patient requesting SNF placement, assistance with payment for pain medication at discharge, and assistance with transportation to [place] once discharged.  Patient able to get up from hospital bed without assistance and lift patient's duffle bag out of cabinet without any complaints or assistance.  CSW consulted with CSW Print production planner regarding patient's care. CSW returned to offer SNF placement within [place] or a bus pass to [place]. Patient refused both SNF placement and bus pass to [place], stating patient will be discharging home (one bedroom apartment in [place]) at time of discharge. Patient continuing to request assistance with payment for pain medication, stating "If I can just get my meds paid for I will be out of your hair." Patient reports patient has a friend to provide transportation home (apartment in [place]) on 08/16/2014. RNCM assisting with pain medication at this time.  Patient had just completed independently showering and currently standing in patient's room with no neck collar on, only wearing boxer and no shirt.  CSW receives a call from [facility], stating patient requesting a plane ticket to [place] rather than a buss pass. CSW spoke with patient and informed patient that patient has been medically cleared to transport via bus and will not be provided a plane ticket. Patient exclaims patient can "barely move my neck" and demanding to speak with MD prior to discharge. CSW informed patient that patient has been discharged and will need to decide if patient will discharge home to [place] apartment, bus pass to [place], or SNF. Patient refuses all three options stating patient is not medically stable for  discharge.  CSW consulted MD regarding patient's request to speak with MD. MD met with patient at discharge and still agreeable to discharge patient home.  Patient to discharge to friend's home at: [place], [place].  Lubertha Say[place], [place] Cell: [phone]       Fax: [phone] Clinical Social Work: Orthopedics [phone]) and Surgical [phone])
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
## 2016-07-22 ENCOUNTER — Emergency Department (HOSPITAL_COMMUNITY): Payer: Self-pay

## 2016-07-22 ENCOUNTER — Emergency Department (HOSPITAL_COMMUNITY)
Admission: EM | Admit: 2016-07-22 | Discharge: 2016-07-22 | Disposition: A | Discharge status: Home / Self Care | Payer: Self-pay | Attending: Physician Assistant | Admitting: Physician Assistant

## 2016-07-22 ENCOUNTER — Encounter (HOSPITAL_COMMUNITY): Payer: Self-pay | Admitting: *Deleted

## 2016-07-22 DIAGNOSIS — Y9289 Other specified places as the place of occurrence of the external cause: Secondary | ICD-10-CM | POA: Insufficient documentation

## 2016-07-22 DIAGNOSIS — J45909 Unspecified asthma, uncomplicated: Secondary | ICD-10-CM | POA: Insufficient documentation

## 2016-07-22 DIAGNOSIS — Y999 Unspecified external cause status: Secondary | ICD-10-CM | POA: Insufficient documentation

## 2016-07-22 DIAGNOSIS — I10 Essential (primary) hypertension: Secondary | ICD-10-CM | POA: Insufficient documentation

## 2016-07-22 DIAGNOSIS — S93401A Sprain of unspecified ligament of right ankle, initial encounter: Secondary | ICD-10-CM | POA: Insufficient documentation

## 2016-07-22 DIAGNOSIS — Z79899 Other long term (current) drug therapy: Secondary | ICD-10-CM | POA: Insufficient documentation

## 2016-07-22 DIAGNOSIS — Y9301 Activity, walking, marching and hiking: Secondary | ICD-10-CM | POA: Insufficient documentation

## 2016-07-22 DIAGNOSIS — S90111A Contusion of right great toe without damage to nail, initial encounter: Secondary | ICD-10-CM | POA: Insufficient documentation

## 2016-07-22 DIAGNOSIS — W010XXA Fall on same level from slipping, tripping and stumbling without subsequent striking against object, initial encounter: Secondary | ICD-10-CM | POA: Insufficient documentation

## 2016-07-22 MED ORDER — HYDROCODONE-ACETAMINOPHEN 5-325 MG PO TABS: 1.0000 | ORAL_TABLET | Freq: Four times a day (QID) | ORAL | 0 refills | Status: DC | PRN

## 2016-07-22 MED ORDER — HYDROCODONE-ACETAMINOPHEN 5-325 MG PO TABS
1.0000 | ORAL_TABLET | Freq: Once | ORAL | Status: AC
  Administered 2016-07-22: 1 via ORAL
  Filled 2016-07-22: qty 1

## 2016-07-22 MED ORDER — DICLOFENAC SODIUM 50 MG PO TBEC: 50.0000 mg | DELAYED_RELEASE_TABLET | Freq: Two times a day (BID) | ORAL | 0 refills | Status: DC
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
## 2016-07-22 NOTE — ED Notes (Signed)
See providers assessment.  

## 2016-07-22 NOTE — ED Triage Notes (Signed)
The pt fell just prior to arrival  He is c/o rt ankle pain with sl swelling

## 2016-07-22 NOTE — ED Notes (Signed)
Otho bedside  

## 2016-07-22 NOTE — Discharge Instructions (Signed)
Follow up with DR. Murphy for your ankle sprain. Return here as needed.

## 2016-07-22 NOTE — Progress Notes (Signed)
Orthopedic Tech Progress Note Patient Details:  Traci MinorsKeyoka Novak 02/04/1986 [id number]  Ortho Devices Type of Ortho Device: CAM walker, Crutches Ortho Device/Splint Location: Applied Cam Walker to pt Right ankle foot.  Pt tolerated well.  Provided and trained pt for crutches.  Pt used crutches previously.  (Right Foot/ankle) Ortho Device/Splint Interventions: Application, Adjustment   Alvina ChouWilliams, Keeleigh Terris C 07/22/2016, 10:18 PM

## 2016-08-31 ENCOUNTER — Encounter (HOSPITAL_COMMUNITY): Payer: Self-pay | Admitting: Emergency Medicine

## 2016-08-31 ENCOUNTER — Emergency Department (HOSPITAL_COMMUNITY)
Admission: EM | Admit: 2016-08-31 | Discharge: 2016-08-31 | Discharge status: Against Medical Advice | Payer: Medicaid - Out of State | Attending: Emergency Medicine | Admitting: Emergency Medicine

## 2016-08-31 ENCOUNTER — Emergency Department (HOSPITAL_COMMUNITY): Payer: Medicaid - Out of State

## 2016-08-31 DIAGNOSIS — W2105XA Struck by basketball, initial encounter: Secondary | ICD-10-CM | POA: Insufficient documentation

## 2016-08-31 DIAGNOSIS — Z79899 Other long term (current) drug therapy: Secondary | ICD-10-CM | POA: Insufficient documentation

## 2016-08-31 DIAGNOSIS — Y929 Unspecified place or not applicable: Secondary | ICD-10-CM | POA: Insufficient documentation

## 2016-08-31 DIAGNOSIS — Y939 Activity, unspecified: Secondary | ICD-10-CM | POA: Insufficient documentation

## 2016-08-31 DIAGNOSIS — I10 Essential (primary) hypertension: Secondary | ICD-10-CM | POA: Insufficient documentation

## 2016-08-31 DIAGNOSIS — S99921A Unspecified injury of right foot, initial encounter: Secondary | ICD-10-CM | POA: Insufficient documentation

## 2016-08-31 DIAGNOSIS — Y999 Unspecified external cause status: Secondary | ICD-10-CM | POA: Insufficient documentation

## 2016-08-31 DIAGNOSIS — S90211A Contusion of right great toe with damage to nail, initial encounter: Secondary | ICD-10-CM

## 2016-08-31 DIAGNOSIS — J45909 Unspecified asthma, uncomplicated: Secondary | ICD-10-CM | POA: Insufficient documentation

## 2016-08-31 MED ORDER — OXYCODONE-ACETAMINOPHEN 5-325 MG PO TABS
1.0000 | ORAL_TABLET | Freq: Once | ORAL | Status: AC
  Administered 2016-08-31: 1 via ORAL
  Filled 2016-08-31: qty 1

## 2016-08-31 NOTE — ED Triage Notes (Signed)
Pt st's she kicked a ball earlier today now has pain in right great toe.  St's she can't bend her toe

## 2016-08-31 NOTE — ED Notes (Signed)
ED Provider at bedside. 
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
## 2016-08-31 NOTE — ED Notes (Signed)
Gave pt.ice pack to apply to r foot

## 2016-08-31 NOTE — ED Notes (Signed)
Patient transported to X-ray 

## 2016-08-31 NOTE — ED Notes (Signed)
Pt states they are ready to leave and have been here too long. This RN advised I would make the doctor aware of pts concerns.

## 2016-08-31 NOTE — ED Notes (Signed)
Pt seen ambulating out of ED.

## 2016-09-13 ENCOUNTER — Emergency Department (HOSPITAL_COMMUNITY)
Admission: EM | Admit: 2016-09-13 | Discharge: 2016-09-14 | Disposition: A | Discharge status: Home / Self Care | Payer: Medicare (Managed Care) | Attending: Emergency Medicine | Admitting: Emergency Medicine

## 2016-09-13 ENCOUNTER — Encounter (HOSPITAL_COMMUNITY): Payer: Self-pay | Admitting: *Deleted

## 2016-09-13 DIAGNOSIS — R1110 Vomiting, unspecified: Secondary | ICD-10-CM | POA: Diagnosis not present

## 2016-09-13 DIAGNOSIS — Z5321 Procedure and treatment not carried out due to patient leaving prior to being seen by health care provider: Secondary | ICD-10-CM | POA: Diagnosis not present

## 2016-09-13 LAB — CBC
HCT: 36.8 % (ref 36.0–46.0)
HEMOGLOBIN: 12.4 g/dL (ref 12.0–15.0)
MCH: 30.5 pg (ref 26.0–34.0)
MCHC: 33.7 g/dL (ref 30.0–36.0)
MCV: 90.4 fL (ref 78.0–100.0)
Platelets: 237 10*3/uL (ref 150–400)
RBC: 4.07 MIL/uL (ref 3.87–5.11)
RDW: 12.9 % (ref 11.5–15.5)
WBC: 7.4 10*3/uL (ref 4.0–10.5)

## 2016-09-13 LAB — COMPREHENSIVE METABOLIC PANEL
ALK PHOS: 44 U/L (ref 38–126)
ALT: 25 U/L (ref 14–54)
AST: 23 U/L (ref 15–41)
Albumin: 4.3 g/dL (ref 3.5–5.0)
Anion gap: 9 (ref 5–15)
BUN: 8 mg/dL (ref 6–20)
CO2: 25 mmol/L (ref 22–32)
CREATININE: 0.94 mg/dL (ref 0.44–1.00)
Calcium: 9.7 mg/dL (ref 8.9–10.3)
Chloride: 103 mmol/L (ref 101–111)
GFR calc Af Amer: >60 mL/min (ref >60)
GFR calc non Af Amer: >60 mL/min (ref >60)
GLUCOSE: 87 mg/dL (ref 65–99)
Potassium: 3.6 mmol/L (ref 3.5–5.1)
SODIUM: 137 mmol/L (ref 135–145)
Total Bilirubin: 0.5 mg/dL (ref 0.3–1.2)
Total Protein: 7 g/dL (ref 6.5–8.1)

## 2016-09-13 LAB — URINALYSIS, ROUTINE W REFLEX MICROSCOPIC
Bilirubin Urine: NEGATIVE
Glucose, UA: NEGATIVE mg/dL
Hgb urine dipstick: NEGATIVE
Ketones, ur: NEGATIVE mg/dL
LEUKOCYTES UA: NEGATIVE
NITRITE: NEGATIVE
Protein, ur: NEGATIVE mg/dL
SPECIFIC GRAVITY, URINE: 1.026 (ref 1.005–1.030)
pH: 6 (ref 5.0–8.0)

## 2016-09-13 LAB — LIPASE, BLOOD: Lipase: 40 U/L (ref 11–51)

## 2016-09-13 MED ORDER — ONDANSETRON 4 MG PO TBDP
8.0000 mg | ORAL_TABLET | Freq: Once | ORAL | Status: AC
  Administered 2016-09-13: 8 mg via ORAL
  Filled 2016-09-13: qty 2

## 2016-09-13 NOTE — ED Triage Notes (Signed)
The pt reports he has had abd pain for 4 days with n and vomiting  He reports that he vomited blood yesterday  He has taken antiacids and that is not helping

## 2016-09-13 NOTE — ED Notes (Signed)
Pt called x2 no answer 

## 2016-09-13 NOTE — ED Notes (Signed)
No answer x2 

## 2016-09-13 NOTE — ED Triage Notes (Signed)
No bm for 2 days

## 2016-09-16 ENCOUNTER — Encounter (HOSPITAL_COMMUNITY): Payer: Self-pay | Admitting: Emergency Medicine

## 2016-09-16 ENCOUNTER — Emergency Department (HOSPITAL_COMMUNITY): Payer: Medicare (Managed Care)

## 2016-09-16 ENCOUNTER — Emergency Department (HOSPITAL_COMMUNITY)
Admission: EM | Admit: 2016-09-16 | Discharge: 2016-09-16 | Disposition: A | Discharge status: Home / Self Care | Payer: Medicare (Managed Care) | Attending: Emergency Medicine | Admitting: Emergency Medicine

## 2016-09-16 DIAGNOSIS — I10 Essential (primary) hypertension: Secondary | ICD-10-CM | POA: Insufficient documentation

## 2016-09-16 DIAGNOSIS — J45909 Unspecified asthma, uncomplicated: Secondary | ICD-10-CM | POA: Diagnosis not present

## 2016-09-16 DIAGNOSIS — R1013 Epigastric pain: Secondary | ICD-10-CM | POA: Diagnosis present

## 2016-09-16 DIAGNOSIS — K2970 Gastritis, unspecified, without bleeding: Secondary | ICD-10-CM | POA: Diagnosis not present

## 2016-09-16 DIAGNOSIS — Z79899 Other long term (current) drug therapy: Secondary | ICD-10-CM | POA: Insufficient documentation

## 2016-09-16 LAB — COMPREHENSIVE METABOLIC PANEL
ALBUMIN: 4.7 g/dL (ref 3.5–5.0)
ALK PHOS: 42 U/L (ref 38–126)
ALT: 27 U/L (ref 14–54)
ANION GAP: 6 (ref 5–15)
AST: 24 U/L (ref 15–41)
BILIRUBIN TOTAL: 0.4 mg/dL (ref 0.3–1.2)
BUN: 10 mg/dL (ref 6–20)
CO2: 27 mmol/L (ref 22–32)
Calcium: 9.7 mg/dL (ref 8.9–10.3)
Chloride: 105 mmol/L (ref 101–111)
Creatinine, Ser: 0.98 mg/dL (ref 0.44–1.00)
GFR calc Af Amer: >60 mL/min (ref >60)
GFR calc non Af Amer: >60 mL/min (ref >60)
GLUCOSE: 92 mg/dL (ref 65–99)
Potassium: 4.3 mmol/L (ref 3.5–5.1)
Sodium: 138 mmol/L (ref 135–145)
TOTAL PROTEIN: 7.7 g/dL (ref 6.5–8.1)

## 2016-09-16 LAB — I-STAT CHEM 8, ED
BUN: 9 mg/dL (ref 6–20)
CALCIUM ION: 1.29 mmol/L (ref 1.15–1.40)
CREATININE: 1.1 mg/dL — AB (ref 0.44–1.00)
Chloride: 102 mmol/L (ref 101–111)
GLUCOSE: 87 mg/dL (ref 65–99)
HCT: 41 % (ref 36.0–46.0)
HEMOGLOBIN: 13.9 g/dL (ref 12.0–15.0)
Potassium: 4.3 mmol/L (ref 3.5–5.1)
Sodium: 138 mmol/L (ref 135–145)
TCO2: 26 mmol/L (ref 0–100)

## 2016-09-16 LAB — CBC
HCT: 39.7 % (ref 36.0–46.0)
Hemoglobin: 13.6 g/dL (ref 12.0–15.0)
MCH: 31.5 pg (ref 26.0–34.0)
MCHC: 34.3 g/dL (ref 30.0–36.0)
MCV: 91.9 fL (ref 78.0–100.0)
PLATELETS: 243 10*3/uL (ref 150–400)
RBC: 4.32 MIL/uL (ref 3.87–5.11)
RDW: 13 % (ref 11.5–15.5)
WBC: 5.3 10*3/uL (ref 4.0–10.5)

## 2016-09-16 LAB — TYPE AND SCREEN
ABO/RH(D): O POS
ANTIBODY SCREEN: NEGATIVE

## 2016-09-16 LAB — AMMONIA: Ammonia: 20 umol/L (ref 9–35)

## 2016-09-16 LAB — ETHANOL: Alcohol, Ethyl (B): <5 mg/dL (ref <5)

## 2016-09-16 LAB — I-STAT CG4 LACTIC ACID, ED: Lactic Acid, Venous: 0.62 mmol/L (ref 0.5–1.9)

## 2016-09-16 LAB — POC OCCULT BLOOD, ED: FECAL OCCULT BLD: NEGATIVE

## 2016-09-16 LAB — PROTIME-INR
INR: 1.02
Prothrombin Time: 13.4 seconds (ref 11.4–15.2)

## 2016-09-16 LAB — LIPASE, BLOOD: Lipase: 34 U/L (ref 11–51)

## 2016-09-16 LAB — ABO/RH: ABO/RH(D): O POS

## 2016-09-16 MED ORDER — TRAMADOL HCL 50 MG PO TABS: 50.0000 mg | ORAL_TABLET | Freq: Four times a day (QID) | ORAL | 0 refills | Status: DC | PRN

## 2016-09-16 MED ORDER — LIDOCAINE VISCOUS 2 % MT SOLN: 20.0000 mL | OROMUCOSAL | 0 refills | Status: AC | PRN

## 2016-09-16 MED ORDER — ONDANSETRON 4 MG PO TBDP
4.0000 mg | ORAL_TABLET | Freq: Once | ORAL | Status: AC
  Administered 2016-09-16: 4 mg via ORAL
  Filled 2016-09-16: qty 1

## 2016-09-16 MED ORDER — SUCRALFATE 1 G PO TABS: 1.0000 g | ORAL_TABLET | Freq: Three times a day (TID) | ORAL | 0 refills | Status: AC

## 2016-09-16 MED ORDER — DEXLANSOPRAZOLE 30 MG PO CPDR: 30.0000 mg | DELAYED_RELEASE_CAPSULE | Freq: Every day | ORAL | 2 refills | Status: AC

## 2016-09-16 MED ORDER — ONDANSETRON HCL 4 MG PO TABS: 4.0000 mg | ORAL_TABLET | Freq: Three times a day (TID) | ORAL | 0 refills | Status: AC | PRN

## 2016-09-16 MED ORDER — MORPHINE SULFATE (PF) 4 MG/ML IV SOLN
4.0000 mg | Freq: Once | INTRAVENOUS | Status: AC
  Administered 2016-09-16: 4 mg via INTRAVENOUS
  Filled 2016-09-16: qty 1

## 2016-09-16 MED ORDER — GI COCKTAIL ~~LOC~~
30.0000 mL | Freq: Once | ORAL | Status: AC
  Administered 2016-09-16: 30 mL via ORAL
  Filled 2016-09-16: qty 30
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
## 2016-09-16 NOTE — Discharge Instructions (Signed)
Follow these instructions at home: Take over-the-counter and prescription medicines only as told by your health care provider. If you were prescribed an antibiotic, take it as told by your health care provider. Do not stop taking the antibiotic even if you start to feel better. Drink enough fluid to keep your urine clear or pale yellow. Eat small, frequent meals instead of large meals. Contact a health care provider if: Your symptoms get worse. Your symptoms return after treatment. Get help right away if: You vomit blood or material that looks like coffee grounds. You have black or dark red stools. You are unable to keep fluids down. Your abdominal pain gets worse. You have a fever. You do not feel better after 1 week.

## 2016-09-16 NOTE — ED Triage Notes (Signed)
Pt verbalizes dark blood in stool onset yesterday; Pt verbalizes coffee ground vomiting 2-3 days ago but not today.

## 2016-11-14 DIAGNOSIS — Z79899 Other long term (current) drug therapy: Secondary | ICD-10-CM | POA: Insufficient documentation

## 2016-11-14 DIAGNOSIS — J45909 Unspecified asthma, uncomplicated: Secondary | ICD-10-CM | POA: Insufficient documentation

## 2016-11-14 DIAGNOSIS — K5900 Constipation, unspecified: Secondary | ICD-10-CM | POA: Insufficient documentation

## 2016-11-14 DIAGNOSIS — R319 Hematuria, unspecified: Secondary | ICD-10-CM | POA: Insufficient documentation

## 2016-11-14 DIAGNOSIS — I10 Essential (primary) hypertension: Secondary | ICD-10-CM | POA: Insufficient documentation

## 2016-11-15 ENCOUNTER — Emergency Department (HOSPITAL_COMMUNITY): Payer: Self-pay

## 2016-11-15 ENCOUNTER — Encounter (HOSPITAL_COMMUNITY): Payer: Self-pay

## 2016-11-15 ENCOUNTER — Emergency Department (HOSPITAL_COMMUNITY)
Admission: EM | Admit: 2016-11-15 | Discharge: 2016-11-15 | Disposition: A | Discharge status: Home / Self Care | Payer: Self-pay | Attending: Emergency Medicine | Admitting: Emergency Medicine

## 2016-11-15 DIAGNOSIS — K5900 Constipation, unspecified: Secondary | ICD-10-CM

## 2016-11-15 DIAGNOSIS — R319 Hematuria, unspecified: Secondary | ICD-10-CM

## 2016-11-15 LAB — URINALYSIS, ROUTINE W REFLEX MICROSCOPIC
BACTERIA UA: NONE SEEN
BILIRUBIN URINE: NEGATIVE
Glucose, UA: NEGATIVE mg/dL
KETONES UR: NEGATIVE mg/dL
LEUKOCYTES UA: NEGATIVE
Nitrite: NEGATIVE
PH: 5 (ref 5.0–8.0)
PROTEIN: NEGATIVE mg/dL
Specific Gravity, Urine: 1.026 (ref 1.005–1.030)

## 2016-11-15 LAB — GC/CHLAMYDIA PROBE AMP (~~LOC~~) NOT AT ARMC
CHLAMYDIA, DNA PROBE: NEGATIVE
Neisseria Gonorrhea: NEGATIVE

## 2016-11-15 LAB — WET PREP, GENITAL
SPERM: NONE SEEN
Trich, Wet Prep: NONE SEEN
YEAST WET PREP: NONE SEEN

## 2016-11-15 LAB — POC URINE PREG, ED: Preg Test, Ur: NEGATIVE

## 2016-11-15 MED ORDER — KETOROLAC TROMETHAMINE 30 MG/ML IJ SOLN
30.0000 mg | Freq: Once | INTRAMUSCULAR | Status: AC
  Administered 2016-11-15: 30 mg via INTRAVENOUS
  Filled 2016-11-15: qty 1

## 2016-11-15 MED ORDER — IBUPROFEN 600 MG PO TABS: 600.0000 mg | ORAL_TABLET | Freq: Four times a day (QID) | ORAL | 0 refills | Status: DC | PRN

## 2016-11-15 MED ORDER — POLYETHYLENE GLYCOL 3350 17 GM/SCOOP PO POWD: 17.0000 g | Freq: Two times a day (BID) | ORAL | 0 refills | Status: AC

## 2016-11-15 NOTE — ED Notes (Signed)
Bed: [id number] Expected date:  Expected time:  Means of arrival:  Comments: Hold-taking Room 2
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
## 2016-11-15 NOTE — Discharge Instructions (Signed)
Try to increase your daily dose of dietary fiber. Take MiraLAX as prescribed for constipation. It is also possible that you may have a small kidney stone not shown on CT causing your pain and blood in your urine. Take ibuprofen for continued pain, if it persists. Follow-up with a primary care doctor to ensure resolution of symptoms.

## 2016-11-15 NOTE — ED Triage Notes (Signed)
Pt complains of a pressure like vaginal pain

## 2016-11-15 NOTE — ED Notes (Signed)
Bed: WA06 Expected date:  Expected time:  Means of arrival:  Comments: 

## 2016-11-23 ENCOUNTER — Emergency Department (HOSPITAL_COMMUNITY): Payer: Medicare (Managed Care)

## 2016-11-23 ENCOUNTER — Emergency Department (HOSPITAL_COMMUNITY)
Admission: EM | Admit: 2016-11-23 | Discharge: 2016-11-23 | Disposition: A | Discharge status: Home / Self Care | Payer: Medicare (Managed Care) | Attending: Emergency Medicine | Admitting: Emergency Medicine

## 2016-11-23 ENCOUNTER — Encounter (HOSPITAL_COMMUNITY): Payer: Self-pay | Admitting: Emergency Medicine

## 2016-11-23 DIAGNOSIS — Y999 Unspecified external cause status: Secondary | ICD-10-CM | POA: Diagnosis not present

## 2016-11-23 DIAGNOSIS — R51 Headache: Secondary | ICD-10-CM | POA: Diagnosis not present

## 2016-11-23 DIAGNOSIS — Y92410 Unspecified street and highway as the place of occurrence of the external cause: Secondary | ICD-10-CM | POA: Insufficient documentation

## 2016-11-23 DIAGNOSIS — M791 Myalgia: Secondary | ICD-10-CM | POA: Insufficient documentation

## 2016-11-23 DIAGNOSIS — J45909 Unspecified asthma, uncomplicated: Secondary | ICD-10-CM | POA: Insufficient documentation

## 2016-11-23 DIAGNOSIS — I10 Essential (primary) hypertension: Secondary | ICD-10-CM | POA: Diagnosis not present

## 2016-11-23 DIAGNOSIS — Y9389 Activity, other specified: Secondary | ICD-10-CM | POA: Insufficient documentation

## 2016-11-23 DIAGNOSIS — E119 Type 2 diabetes mellitus without complications: Secondary | ICD-10-CM | POA: Diagnosis not present

## 2016-11-23 DIAGNOSIS — M542 Cervicalgia: Secondary | ICD-10-CM | POA: Diagnosis not present

## 2016-11-23 HISTORY — DX: Type 2 diabetes mellitus without complications: E11.9

## 2016-11-23 LAB — POC URINE PREG, ED: PREG TEST UR: NEGATIVE

## 2016-11-23 MED ORDER — MORPHINE SULFATE (PF) 4 MG/ML IV SOLN: 4.0000 mg | Freq: Once | INTRAVENOUS | Status: DC

## 2016-11-23 MED ORDER — MORPHINE SULFATE (PF) 4 MG/ML IV SOLN
4.0000 mg | Freq: Once | INTRAVENOUS | Status: AC
  Administered 2016-11-23: 4 mg via INTRAMUSCULAR
  Filled 2016-11-23: qty 1

## 2016-11-23 MED ORDER — ALBUTEROL SULFATE HFA 108 (90 BASE) MCG/ACT IN AERS: 1.0000 | INHALATION_SPRAY | Freq: Four times a day (QID) | RESPIRATORY_TRACT | 0 refills | Status: DC | PRN

## 2016-11-23 MED ORDER — ACETAMINOPHEN 500 MG PO TABS
1000.0000 mg | ORAL_TABLET | Freq: Once | ORAL | Status: AC
  Administered 2016-11-23: 1000 mg via ORAL
  Filled 2016-11-23: qty 2

## 2016-11-23 MED ORDER — METHOCARBAMOL 500 MG PO TABS: 500.0000 mg | ORAL_TABLET | Freq: Three times a day (TID) | ORAL | 0 refills | Status: AC | PRN
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
## 2016-11-26 ENCOUNTER — Emergency Department (HOSPITAL_COMMUNITY)
Admission: EM | Admit: 2016-11-26 | Discharge: 2016-11-27 | Disposition: A | Discharge status: Home / Self Care | Payer: Medicare (Managed Care) | Attending: Emergency Medicine | Admitting: Emergency Medicine

## 2016-11-26 DIAGNOSIS — S20211A Contusion of right front wall of thorax, initial encounter: Secondary | ICD-10-CM | POA: Diagnosis not present

## 2016-11-26 DIAGNOSIS — M545 Low back pain, unspecified: Secondary | ICD-10-CM

## 2016-11-26 DIAGNOSIS — Y999 Unspecified external cause status: Secondary | ICD-10-CM | POA: Insufficient documentation

## 2016-11-26 DIAGNOSIS — Y9389 Activity, other specified: Secondary | ICD-10-CM | POA: Diagnosis not present

## 2016-11-26 DIAGNOSIS — J45909 Unspecified asthma, uncomplicated: Secondary | ICD-10-CM | POA: Insufficient documentation

## 2016-11-26 DIAGNOSIS — R0781 Pleurodynia: Secondary | ICD-10-CM

## 2016-11-26 DIAGNOSIS — S299XXA Unspecified injury of thorax, initial encounter: Secondary | ICD-10-CM | POA: Diagnosis present

## 2016-11-26 DIAGNOSIS — I10 Essential (primary) hypertension: Secondary | ICD-10-CM | POA: Diagnosis not present

## 2016-11-26 DIAGNOSIS — Y92410 Unspecified street and highway as the place of occurrence of the external cause: Secondary | ICD-10-CM | POA: Insufficient documentation

## 2016-11-26 DIAGNOSIS — E119 Type 2 diabetes mellitus without complications: Secondary | ICD-10-CM | POA: Diagnosis not present

## 2016-11-26 MED ORDER — METHOCARBAMOL 500 MG PO TABS
1000.0000 mg | ORAL_TABLET | Freq: Once | ORAL | Status: AC
  Administered 2016-11-27: 1000 mg via ORAL
  Filled 2016-11-26: qty 2

## 2016-11-26 MED ORDER — IBUPROFEN 800 MG PO TABS
800.0000 mg | ORAL_TABLET | Freq: Once | ORAL | Status: AC
  Administered 2016-11-27: 800 mg via ORAL
  Filled 2016-11-26: qty 1
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
## 2016-11-27 ENCOUNTER — Emergency Department (HOSPITAL_COMMUNITY): Payer: Medicare (Managed Care)

## 2016-11-27 MED ORDER — ONDANSETRON 4 MG PO TBDP
4.0000 mg | ORAL_TABLET | Freq: Once | ORAL | Status: AC
  Administered 2016-11-27: 4 mg via ORAL
  Filled 2016-11-27: qty 1

## 2016-11-27 MED ORDER — IBUPROFEN 800 MG PO TABS: 800.0000 mg | ORAL_TABLET | Freq: Three times a day (TID) | ORAL | 0 refills | Status: AC

## 2016-11-27 MED ORDER — METHOCARBAMOL 500 MG PO TABS: 500.0000 mg | ORAL_TABLET | Freq: Three times a day (TID) | ORAL | 0 refills | Status: AC | PRN

## 2016-11-27 MED ORDER — TRAMADOL HCL 50 MG PO TABS: 50.0000 mg | ORAL_TABLET | Freq: Four times a day (QID) | ORAL | 0 refills | Status: AC | PRN

## 2016-11-27 NOTE — Discharge Instructions (Signed)
Ice to painful areas. Expect slow improvement of your symptoms over the next 7-10 days
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
## 2017-05-01 ENCOUNTER — Emergency Department (HOSPITAL_COMMUNITY)
Admission: EM | Admit: 2017-05-01 | Discharge: 2017-05-01 | Disposition: A | Discharge status: Home / Self Care | Payer: Medicaid Other | Attending: Emergency Medicine | Admitting: Emergency Medicine

## 2017-05-01 ENCOUNTER — Encounter (HOSPITAL_COMMUNITY): Payer: Self-pay | Admitting: Emergency Medicine

## 2017-05-01 ENCOUNTER — Other Ambulatory Visit: Payer: Self-pay

## 2017-05-01 ENCOUNTER — Emergency Department (HOSPITAL_COMMUNITY): Payer: Medicaid Other

## 2017-05-01 DIAGNOSIS — J4521 Mild intermittent asthma with (acute) exacerbation: Secondary | ICD-10-CM | POA: Insufficient documentation

## 2017-05-01 DIAGNOSIS — Z79899 Other long term (current) drug therapy: Secondary | ICD-10-CM | POA: Diagnosis not present

## 2017-05-01 DIAGNOSIS — E119 Type 2 diabetes mellitus without complications: Secondary | ICD-10-CM | POA: Diagnosis not present

## 2017-05-01 DIAGNOSIS — I10 Essential (primary) hypertension: Secondary | ICD-10-CM | POA: Diagnosis not present

## 2017-05-01 DIAGNOSIS — R0602 Shortness of breath: Secondary | ICD-10-CM | POA: Diagnosis present

## 2017-05-01 LAB — BASIC METABOLIC PANEL
Anion gap: 6 (ref 5–15)
BUN: 13 mg/dL (ref 6–20)
CALCIUM: 9.2 mg/dL (ref 8.9–10.3)
CO2: 24 mmol/L (ref 22–32)
CREATININE: 0.97 mg/dL (ref 0.44–1.00)
Chloride: 110 mmol/L (ref 101–111)
GLUCOSE: 90 mg/dL (ref 65–99)
Potassium: 3.8 mmol/L (ref 3.5–5.1)
Sodium: 140 mmol/L (ref 135–145)

## 2017-05-01 LAB — CBC
HCT: 36.8 % (ref 36.0–46.0)
Hemoglobin: 12.4 g/dL (ref 12.0–15.0)
MCH: 31 pg (ref 26.0–34.0)
MCHC: 33.7 g/dL (ref 30.0–36.0)
MCV: 92 fL (ref 78.0–100.0)
PLATELETS: 259 10*3/uL (ref 150–400)
RBC: 4 MIL/uL (ref 3.87–5.11)
RDW: 13.6 % (ref 11.5–15.5)
WBC: 6.8 10*3/uL (ref 4.0–10.5)

## 2017-05-01 LAB — I-STAT BETA HCG BLOOD, ED (MC, WL, AP ONLY): I-stat hCG, quantitative: <5 m[IU]/mL (ref <5)

## 2017-05-01 LAB — I-STAT TROPONIN, ED: TROPONIN I, POC: 0 ng/mL (ref 0.00–0.08)

## 2017-05-01 MED ORDER — PREDNISONE 20 MG PO TABS
60.0000 mg | ORAL_TABLET | Freq: Once | ORAL | Status: AC
  Administered 2017-05-01: 60 mg via ORAL
  Filled 2017-05-01: qty 3

## 2017-05-01 MED ORDER — PREDNISONE 20 MG PO TABS: 40.0000 mg | ORAL_TABLET | Freq: Every day | ORAL | 0 refills | Status: DC

## 2017-05-01 MED ORDER — ALBUTEROL SULFATE HFA 108 (90 BASE) MCG/ACT IN AERS
2.0000 | INHALATION_SPRAY | RESPIRATORY_TRACT | Status: DC | PRN
  Administered 2017-05-01: 2 via RESPIRATORY_TRACT
  Filled 2017-05-01: qty 6.7

## 2017-05-01 MED ORDER — ALBUTEROL SULFATE (2.5 MG/3ML) 0.083% IN NEBU
5.0000 mg | INHALATION_SOLUTION | Freq: Once | RESPIRATORY_TRACT | Status: AC
Start: 2017-05-01 — End: 2017-05-01
  Administered 2017-05-01: 5 mg via RESPIRATORY_TRACT

## 2017-05-01 MED ORDER — ALBUTEROL SULFATE HFA 108 (90 BASE) MCG/ACT IN AERS: 2.0000 | INHALATION_SPRAY | RESPIRATORY_TRACT | 2 refills | Status: AC | PRN

## 2017-05-01 NOTE — ED Triage Notes (Signed)
Pt c/o asthma exacerbation for the past few hours with 6/10 central cp, pt very labored breathing on triage.
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
## 2017-07-29 ENCOUNTER — Other Ambulatory Visit: Payer: Self-pay

## 2017-07-29 ENCOUNTER — Encounter (HOSPITAL_COMMUNITY): Payer: Self-pay | Admitting: Emergency Medicine

## 2017-07-29 DIAGNOSIS — J309 Allergic rhinitis, unspecified: Secondary | ICD-10-CM | POA: Insufficient documentation

## 2017-07-29 DIAGNOSIS — I10 Essential (primary) hypertension: Secondary | ICD-10-CM | POA: Insufficient documentation

## 2017-07-29 DIAGNOSIS — R0981 Nasal congestion: Secondary | ICD-10-CM | POA: Diagnosis present

## 2017-07-29 DIAGNOSIS — E119 Type 2 diabetes mellitus without complications: Secondary | ICD-10-CM | POA: Insufficient documentation

## 2017-07-29 DIAGNOSIS — J45909 Unspecified asthma, uncomplicated: Secondary | ICD-10-CM | POA: Diagnosis not present

## 2017-07-29 NOTE — ED Triage Notes (Signed)
Pt is c/o sinus pressure and pain  Pt states his ears feel clogged up  Pt states he has been taking benadryl without relief

## 2017-07-30 ENCOUNTER — Emergency Department (HOSPITAL_COMMUNITY)
Admission: EM | Admit: 2017-07-30 | Discharge: 2017-07-30 | Disposition: A | Discharge status: Home / Self Care | Payer: Medicaid Other | Attending: Emergency Medicine | Admitting: Emergency Medicine

## 2017-07-30 DIAGNOSIS — J309 Allergic rhinitis, unspecified: Secondary | ICD-10-CM

## 2017-07-30 MED ORDER — LORATADINE-PSEUDOEPHEDRINE ER 10-240 MG PO TB24: 1.0000 | ORAL_TABLET | Freq: Every day | ORAL | 0 refills | Status: AC

## 2017-07-30 MED ORDER — TRIAMCINOLONE ACETONIDE 55 MCG/ACT NA AERO: 2.0000 | INHALATION_SPRAY | Freq: Every day | NASAL | 12 refills | Status: AC

## 2017-07-30 NOTE — ED Notes (Signed)
This Clinical research associatewriter spoke with patient. Patient with loud pressured speech at this writer demanding to "see a doctor now". Patient states that he has been waiting and if he doesn't get seen, "I am calling my lawyer". Patient with strong arm movements at this writer with loud voice. Explained his triage acuity of 4 and explained triage process. Patient states he does not care. Attempted to give patient Patient Experience card and patient refused card.

## 2017-07-30 NOTE — ED Notes (Signed)
Pt refused repeat vital signs.

## 2017-07-30 NOTE — ED Notes (Signed)
Patient came to nurses station and asked if there was any "administration" here at this time. Writer informed patient there was no admin in the building at this time. Patient then asked for the "highest of the high in charge" to come to their room and said "I have been asking for them to come to my room for hours now". Charge RN made aware.

## 2017-07-30 NOTE — ED Notes (Signed)
Charge RN went to bedside to talk with patient about the problems they were experiencing during their visit. Patient got very loud with Charge RN and refused patient experience card that was given to them. Patient also stated they would be contacting their attorney.
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
## 2017-07-30 NOTE — ED Notes (Addendum)
When this nurse entered the room to greet and assess the pt, she became upset and started yelling about the wait time and asked when the provider would see her that she has to work in the morning. Pt stated that it is ridiculous that all the other patients were taken back before her that do not look sick. This nurse attempted to explain that we have to see people based on severity and we would try to see her as soon as possible. Pt states she doesn't care how we do things she needs to see someone now.

## 2017-08-30 ENCOUNTER — Encounter (HOSPITAL_COMMUNITY): Payer: Self-pay | Admitting: Emergency Medicine

## 2017-08-30 ENCOUNTER — Emergency Department (HOSPITAL_COMMUNITY)
Admission: EM | Admit: 2017-08-30 | Discharge: 2017-08-30 | Disposition: A | Discharge status: Home / Self Care | Payer: Medicaid Other | Attending: Emergency Medicine | Admitting: Emergency Medicine

## 2017-08-30 DIAGNOSIS — Z5321 Procedure and treatment not carried out due to patient leaving prior to being seen by health care provider: Secondary | ICD-10-CM | POA: Insufficient documentation

## 2017-08-30 DIAGNOSIS — J45901 Unspecified asthma with (acute) exacerbation: Secondary | ICD-10-CM | POA: Insufficient documentation

## 2017-08-30 MED ORDER — ALBUTEROL SULFATE (2.5 MG/3ML) 0.083% IN NEBU
INHALATION_SOLUTION | RESPIRATORY_TRACT | Status: AC
  Administered 2017-08-30: 5 mg via RESPIRATORY_TRACT
  Filled 2017-08-30: qty 6

## 2017-08-30 MED ORDER — ALBUTEROL SULFATE (2.5 MG/3ML) 0.083% IN NEBU
5.0000 mg | INHALATION_SOLUTION | Freq: Once | RESPIRATORY_TRACT | Status: AC
  Administered 2017-08-30: 5 mg via RESPIRATORY_TRACT

## 2017-08-30 NOTE — ED Notes (Signed)
Called Pt to take to room. No answer

## 2017-08-30 NOTE — ED Triage Notes (Signed)
Pt reports asthma exacerbation onset this AM. States allergies have been flaring up over the past two weeks.

## 2017-12-07 ENCOUNTER — Other Ambulatory Visit: Payer: Self-pay

## 2017-12-07 ENCOUNTER — Emergency Department (HOSPITAL_COMMUNITY)
Admission: EM | Admit: 2017-12-07 | Discharge: 2017-12-07 | Disposition: A | Discharge status: Home / Self Care | Payer: No Typology Code available for payment source | Attending: Emergency Medicine | Admitting: Emergency Medicine

## 2017-12-07 ENCOUNTER — Emergency Department (HOSPITAL_COMMUNITY): Payer: No Typology Code available for payment source

## 2017-12-07 ENCOUNTER — Encounter (HOSPITAL_COMMUNITY): Payer: Self-pay | Admitting: Emergency Medicine

## 2017-12-07 DIAGNOSIS — E119 Type 2 diabetes mellitus without complications: Secondary | ICD-10-CM | POA: Diagnosis not present

## 2017-12-07 DIAGNOSIS — J45909 Unspecified asthma, uncomplicated: Secondary | ICD-10-CM | POA: Insufficient documentation

## 2017-12-07 DIAGNOSIS — R1012 Left upper quadrant pain: Secondary | ICD-10-CM | POA: Insufficient documentation

## 2017-12-07 DIAGNOSIS — Z789 Other specified health status: Secondary | ICD-10-CM

## 2017-12-07 DIAGNOSIS — I10 Essential (primary) hypertension: Secondary | ICD-10-CM | POA: Diagnosis not present

## 2017-12-07 DIAGNOSIS — R079 Chest pain, unspecified: Secondary | ICD-10-CM | POA: Diagnosis not present

## 2017-12-07 DIAGNOSIS — Z7289 Other problems related to lifestyle: Secondary | ICD-10-CM

## 2017-12-07 DIAGNOSIS — F1099 Alcohol use, unspecified with unspecified alcohol-induced disorder: Secondary | ICD-10-CM | POA: Insufficient documentation

## 2017-12-07 DIAGNOSIS — R109 Unspecified abdominal pain: Secondary | ICD-10-CM

## 2017-12-07 DIAGNOSIS — R51 Headache: Secondary | ICD-10-CM | POA: Insufficient documentation

## 2017-12-07 DIAGNOSIS — Z79899 Other long term (current) drug therapy: Secondary | ICD-10-CM | POA: Diagnosis not present

## 2017-12-07 LAB — CBC
HCT: 43.5 % (ref 36.0–46.0)
HEMOGLOBIN: 14.5 g/dL (ref 12.0–15.0)
MCH: 30.4 pg (ref 26.0–34.0)
MCHC: 33.3 g/dL (ref 30.0–36.0)
MCV: 91.2 fL (ref 78.0–100.0)
Platelets: 354 10*3/uL (ref 150–400)
RBC: 4.77 MIL/uL (ref 3.87–5.11)
RDW: 12.8 % (ref 11.5–15.5)
WBC: 12.3 10*3/uL — ABNORMAL HIGH (ref 4.0–10.5)

## 2017-12-07 LAB — COMPREHENSIVE METABOLIC PANEL
ALBUMIN: 5 g/dL (ref 3.5–5.0)
ALT: 93 U/L — ABNORMAL HIGH (ref 0–44)
AST: 107 U/L — ABNORMAL HIGH (ref 15–41)
Alkaline Phosphatase: 51 U/L (ref 38–126)
Anion gap: 13 (ref 5–15)
BUN: 12 mg/dL (ref 6–20)
CHLORIDE: 104 mmol/L (ref 98–111)
CO2: 25 mmol/L (ref 22–32)
Calcium: 10 mg/dL (ref 8.9–10.3)
Creatinine, Ser: 1.05 mg/dL — ABNORMAL HIGH (ref 0.44–1.00)
GFR calc non Af Amer: >60 mL/min (ref >60)
Glucose, Bld: 88 mg/dL (ref 70–99)
POTASSIUM: 3.7 mmol/L (ref 3.5–5.1)
SODIUM: 142 mmol/L (ref 135–145)
Total Bilirubin: 0.6 mg/dL (ref 0.3–1.2)
Total Protein: 8.5 g/dL — ABNORMAL HIGH (ref 6.5–8.1)

## 2017-12-07 LAB — I-STAT TROPONIN, ED: Troponin i, poc: 0 ng/mL (ref 0.00–0.08)

## 2017-12-07 LAB — I-STAT BETA HCG BLOOD, ED (MC, WL, AP ONLY)

## 2017-12-07 LAB — ETHANOL: Alcohol, Ethyl (B): 48 mg/dL — ABNORMAL HIGH (ref <10)

## 2017-12-07 MED ORDER — ONDANSETRON HCL 4 MG/2ML IJ SOLN
4.0000 mg | Freq: Once | INTRAMUSCULAR | Status: AC
  Administered 2017-12-07: 4 mg via INTRAVENOUS
  Filled 2017-12-07: qty 2

## 2017-12-07 MED ORDER — IOHEXOL 300 MG/ML  SOLN
100.0000 mL | Freq: Once | INTRAMUSCULAR | Status: AC | PRN
  Administered 2017-12-07: 100 mL via INTRAVENOUS

## 2017-12-07 MED ORDER — SODIUM CHLORIDE 0.9 % IV BOLUS
500.0000 mL | Freq: Once | INTRAVENOUS | Status: AC
Start: 2017-12-07 — End: 2017-12-07
  Administered 2017-12-07: 500 mL via INTRAVENOUS

## 2017-12-07 MED ORDER — MORPHINE SULFATE (PF) 4 MG/ML IV SOLN
4.0000 mg | Freq: Once | INTRAVENOUS | Status: AC
  Administered 2017-12-07: 4 mg via INTRAVENOUS
  Filled 2017-12-07: qty 1
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
## 2017-12-07 NOTE — Discharge Instructions (Addendum)
Take tylenol and motrin as needed for pain. Do not drink alcohol and operate machinery as you could kill yourself or someone's family members.

## 2017-12-07 NOTE — ED Notes (Signed)
Patient transported to CT 

## 2017-12-07 NOTE — ED Notes (Signed)
Pt states " I really want to drink something; I feel like my mouth is just way to dry right now." Explained to patient that she is NPO, provided mouth moisturizer.

## 2018-01-21 ENCOUNTER — Other Ambulatory Visit: Payer: Self-pay

## 2018-03-13 ENCOUNTER — Emergency Department (HOSPITAL_COMMUNITY)
Admission: EM | Admit: 2018-03-13 | Discharge: 2018-03-13 | Disposition: A | Discharge status: Home / Self Care | Payer: Medicaid - Out of State | Attending: Emergency Medicine | Admitting: Emergency Medicine

## 2018-03-13 ENCOUNTER — Other Ambulatory Visit: Payer: Self-pay

## 2018-03-13 ENCOUNTER — Emergency Department (HOSPITAL_COMMUNITY): Payer: Medicaid - Out of State

## 2018-03-13 ENCOUNTER — Encounter (HOSPITAL_COMMUNITY): Payer: Self-pay | Admitting: Emergency Medicine

## 2018-03-13 DIAGNOSIS — E119 Type 2 diabetes mellitus without complications: Secondary | ICD-10-CM | POA: Insufficient documentation

## 2018-03-13 DIAGNOSIS — Y9289 Other specified places as the place of occurrence of the external cause: Secondary | ICD-10-CM | POA: Insufficient documentation

## 2018-03-13 DIAGNOSIS — Y998 Other external cause status: Secondary | ICD-10-CM | POA: Insufficient documentation

## 2018-03-13 DIAGNOSIS — S60222A Contusion of left hand, initial encounter: Secondary | ICD-10-CM | POA: Insufficient documentation

## 2018-03-13 DIAGNOSIS — Z79899 Other long term (current) drug therapy: Secondary | ICD-10-CM | POA: Insufficient documentation

## 2018-03-13 DIAGNOSIS — J45909 Unspecified asthma, uncomplicated: Secondary | ICD-10-CM | POA: Insufficient documentation

## 2018-03-13 DIAGNOSIS — I10 Essential (primary) hypertension: Secondary | ICD-10-CM | POA: Insufficient documentation

## 2018-03-13 DIAGNOSIS — Y9389 Activity, other specified: Secondary | ICD-10-CM | POA: Insufficient documentation

## 2018-03-13 LAB — POC URINE PREG, ED: Preg Test, Ur: NEGATIVE

## 2018-03-13 MED ORDER — ACETAMINOPHEN 500 MG PO TABS
1000.0000 mg | ORAL_TABLET | Freq: Once | ORAL | Status: AC
  Administered 2018-03-13: 1000 mg via ORAL
  Filled 2018-03-13: qty 2

## 2018-03-13 MED ORDER — SULFAMETHOXAZOLE-TRIMETHOPRIM 800-160 MG PO TABS: 1.0000 | ORAL_TABLET | Freq: Two times a day (BID) | ORAL | 0 refills | Status: AC

## 2018-03-13 MED ORDER — CLINDAMYCIN HCL 300 MG PO CAPS: 300.0000 mg | ORAL_CAPSULE | Freq: Three times a day (TID) | ORAL | 0 refills | Status: AC
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
## 2018-03-13 NOTE — Discharge Instructions (Addendum)
You can take Tylenol or Ibuprofen as directed for pain. You can alternate Tylenol and Ibuprofen every 4 hours. If you take Tylenol at 1pm, then you can take Ibuprofen at 5pm. Then you can take Tylenol again at 9pm.   Take antibiotics as directed. Please take all of your antibiotics until finished.  Follow the RICE (Rest, Ice, Compression, Elevation) protocol as directed.   Follow up with the [facility] clinic as needed. Follow with the referred hand doctor if your numbness does not get better.   Return to emergency department for any worsening pain, redness or swelling of the finger, and inability to move the finger, numbness/weakness does not improve, fever or any other worsening or concerning symptoms.
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
## 2018-06-07 ENCOUNTER — Emergency Department (HOSPITAL_COMMUNITY)
Admission: EM | Admit: 2018-06-07 | Discharge: 2018-06-07 | Disposition: A | Discharge status: Home / Self Care | Payer: Self-pay | Attending: Emergency Medicine | Admitting: Emergency Medicine

## 2018-06-07 DIAGNOSIS — Z5321 Procedure and treatment not carried out due to patient leaving prior to being seen by health care provider: Secondary | ICD-10-CM | POA: Insufficient documentation

## 2018-06-07 DIAGNOSIS — R0602 Shortness of breath: Secondary | ICD-10-CM | POA: Insufficient documentation

## 2018-06-07 NOTE — ED Notes (Signed)
Pt is not in [facility].

## 2018-06-07 NOTE — ED Notes (Signed)
Pt left Triage room after tech informing him he needed a urine specimen.  Pt stated "he needed to go to his car."

## 2018-08-03 ENCOUNTER — Encounter (HOSPITAL_COMMUNITY): Payer: Self-pay | Admitting: Emergency Medicine

## 2018-08-03 ENCOUNTER — Emergency Department (HOSPITAL_COMMUNITY)
Admission: EM | Admit: 2018-08-03 | Discharge: 2018-08-03 | Disposition: A | Discharge status: Home / Self Care | Payer: Self-pay | Attending: Emergency Medicine | Admitting: Emergency Medicine

## 2018-08-03 ENCOUNTER — Other Ambulatory Visit: Payer: Self-pay

## 2018-08-03 DIAGNOSIS — Z79899 Other long term (current) drug therapy: Secondary | ICD-10-CM | POA: Insufficient documentation

## 2018-08-03 DIAGNOSIS — I10 Essential (primary) hypertension: Secondary | ICD-10-CM | POA: Insufficient documentation

## 2018-08-03 DIAGNOSIS — J45909 Unspecified asthma, uncomplicated: Secondary | ICD-10-CM | POA: Insufficient documentation

## 2018-08-03 DIAGNOSIS — J069 Acute upper respiratory infection, unspecified: Secondary | ICD-10-CM | POA: Insufficient documentation

## 2018-08-03 DIAGNOSIS — E119 Type 2 diabetes mellitus without complications: Secondary | ICD-10-CM | POA: Insufficient documentation

## 2018-08-03 HISTORY — DX: Allergy, unspecified, initial encounter: T78.40XA

## 2018-08-03 MED ORDER — ALBUTEROL SULFATE HFA 108 (90 BASE) MCG/ACT IN AERS: 1.0000 | INHALATION_SPRAY | Freq: Four times a day (QID) | RESPIRATORY_TRACT | 0 refills | Status: AC | PRN
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
## 2018-08-03 NOTE — ED Triage Notes (Signed)
Recently travel to [place]-- concerned that she has lost sense of smell, has hx of allergies, sinus pressure. Would like a chest xray

## 2018-08-03 NOTE — Discharge Instructions (Signed)
Use antihistamine and albuterol as needed for symptoms/wheezing. Return for shortness of breath, persistent fevers or new concerns. Use Tylenol every 4-6 hours as needed for fever.

## 2019-04-14 ENCOUNTER — Emergency Department (HOSPITAL_COMMUNITY)
Admission: EM | Admit: 2019-04-14 | Discharge: 2019-04-14 | Disposition: A | Discharge status: Home / Self Care | Payer: Self-pay | Attending: Emergency Medicine | Admitting: Emergency Medicine

## 2019-04-14 ENCOUNTER — Emergency Department (HOSPITAL_COMMUNITY): Payer: Self-pay

## 2019-04-14 ENCOUNTER — Other Ambulatory Visit: Payer: Self-pay

## 2019-04-14 ENCOUNTER — Encounter (HOSPITAL_COMMUNITY): Payer: Self-pay | Admitting: *Deleted

## 2019-04-14 DIAGNOSIS — I10 Essential (primary) hypertension: Secondary | ICD-10-CM | POA: Insufficient documentation

## 2019-04-14 DIAGNOSIS — Y999 Unspecified external cause status: Secondary | ICD-10-CM | POA: Insufficient documentation

## 2019-04-14 DIAGNOSIS — I7774 Dissection of vertebral artery: Secondary | ICD-10-CM | POA: Insufficient documentation

## 2019-04-14 DIAGNOSIS — W500XXA Accidental hit or strike by another person, initial encounter: Secondary | ICD-10-CM | POA: Insufficient documentation

## 2019-04-14 DIAGNOSIS — I6502 Occlusion and stenosis of left vertebral artery: Secondary | ICD-10-CM | POA: Insufficient documentation

## 2019-04-14 DIAGNOSIS — Y9367 Activity, basketball: Secondary | ICD-10-CM | POA: Insufficient documentation

## 2019-04-14 DIAGNOSIS — S1980XA Other specified injuries of unspecified part of neck, initial encounter: Secondary | ICD-10-CM | POA: Insufficient documentation

## 2019-04-14 DIAGNOSIS — E119 Type 2 diabetes mellitus without complications: Secondary | ICD-10-CM | POA: Insufficient documentation

## 2019-04-14 DIAGNOSIS — Z87828 Personal history of other (healed) physical injury and trauma: Secondary | ICD-10-CM | POA: Insufficient documentation

## 2019-04-14 DIAGNOSIS — Y92310 Basketball court as the place of occurrence of the external cause: Secondary | ICD-10-CM | POA: Insufficient documentation

## 2019-04-14 LAB — CBC WITH DIFFERENTIAL/PLATELET
Abs Immature Granulocytes: 0.01 10*3/uL (ref 0.00–0.07)
Basophils Absolute: 0 10*3/uL (ref 0.0–0.1)
Basophils Relative: 1 %
Eosinophils Absolute: 0.2 10*3/uL (ref 0.0–0.5)
Eosinophils Relative: 3 %
HCT: 38.8 % (ref 36.0–46.0)
Hemoglobin: 13 g/dL (ref 12.0–15.0)
Immature Granulocytes: 0 %
Lymphocytes Relative: 30 %
Lymphs Abs: 1.8 10*3/uL (ref 0.7–4.0)
MCH: 31.9 pg (ref 26.0–34.0)
MCHC: 33.5 g/dL (ref 30.0–36.0)
MCV: 95.3 fL (ref 80.0–100.0)
Monocytes Absolute: 0.5 10*3/uL (ref 0.1–1.0)
Monocytes Relative: 8 %
Neutro Abs: 3.4 10*3/uL (ref 1.7–7.7)
Neutrophils Relative %: 58 %
Platelets: 243 10*3/uL (ref 150–400)
RBC: 4.07 MIL/uL (ref 3.87–5.11)
RDW: 13.1 % (ref 11.5–15.5)
WBC: 5.9 10*3/uL (ref 4.0–10.5)
nRBC: 0 % (ref 0.0–0.2)

## 2019-04-14 LAB — BASIC METABOLIC PANEL
Anion gap: 8 (ref 5–15)
BUN: 11 mg/dL (ref 6–20)
CO2: 25 mmol/L (ref 22–32)
Calcium: 9.6 mg/dL (ref 8.9–10.3)
Chloride: 107 mmol/L (ref 98–111)
Creatinine, Ser: 0.88 mg/dL (ref 0.44–1.00)
GFR calc Af Amer: >60 mL/min (ref >60)
GFR calc non Af Amer: >60 mL/min (ref >60)
Glucose, Bld: 55 mg/dL — ABNORMAL LOW (ref 70–99)
Potassium: 3.9 mmol/L (ref 3.5–5.1)
Sodium: 140 mmol/L (ref 135–145)

## 2019-04-14 LAB — I-STAT BETA HCG BLOOD, ED (MC, WL, AP ONLY): I-stat hCG, quantitative: <5 m[IU]/mL (ref <5)

## 2019-04-14 MED ORDER — IOHEXOL 350 MG/ML SOLN
75.0000 mL | Freq: Once | INTRAVENOUS | Status: AC | PRN
  Administered 2019-04-14: 14:00:00 75 mL via INTRAVENOUS
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
## 2019-04-14 NOTE — Discharge Instructions (Addendum)
You were seen in the ER for trauma to anterior neck  CT showed stable cervical fractures.  It also noted old injury to your left vertebral artery called a dissection and occlusion.  This has been seen in previous scans.   Although this is not new you need to follow up with neurosurgery to close the loop and make sure they do not recommend any interventions to fix it  Tylenol, ibuprofen as needed for pain. Soft diet.  Return for severe headache, neck pain or stiffness, vision or speech disturbances, ringing in your ear or hearing changes, tingling numbness weakness or drooping in face or extremities, chest pain, shortness of breath
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
## 2019-11-03 ENCOUNTER — Emergency Department (HOSPITAL_COMMUNITY)
Admission: EM | Admit: 2019-11-03 | Discharge: 2019-11-03 | Disposition: A | Discharge status: Home / Self Care | Payer: Self-pay | Attending: Emergency Medicine | Admitting: Emergency Medicine

## 2019-11-03 ENCOUNTER — Emergency Department (HOSPITAL_COMMUNITY): Payer: Self-pay

## 2019-11-03 ENCOUNTER — Encounter (HOSPITAL_COMMUNITY): Payer: Self-pay | Admitting: Emergency Medicine

## 2019-11-03 DIAGNOSIS — Z79899 Other long term (current) drug therapy: Secondary | ICD-10-CM | POA: Insufficient documentation

## 2019-11-03 DIAGNOSIS — I10 Essential (primary) hypertension: Secondary | ICD-10-CM | POA: Insufficient documentation

## 2019-11-03 DIAGNOSIS — E119 Type 2 diabetes mellitus without complications: Secondary | ICD-10-CM | POA: Insufficient documentation

## 2019-11-03 DIAGNOSIS — R42 Dizziness and giddiness: Secondary | ICD-10-CM | POA: Insufficient documentation

## 2019-11-03 DIAGNOSIS — I6502 Occlusion and stenosis of left vertebral artery: Secondary | ICD-10-CM | POA: Insufficient documentation

## 2019-11-03 DIAGNOSIS — M542 Cervicalgia: Secondary | ICD-10-CM | POA: Insufficient documentation

## 2019-11-03 LAB — CBC
HCT: 43.8 % (ref 36.0–46.0)
Hemoglobin: 14.7 g/dL (ref 12.0–15.0)
MCH: 31.1 pg (ref 26.0–34.0)
MCHC: 33.6 g/dL (ref 30.0–36.0)
MCV: 92.8 fL (ref 80.0–100.0)
Platelets: 284 10*3/uL (ref 150–400)
RBC: 4.72 MIL/uL (ref 3.87–5.11)
RDW: 12.3 % (ref 11.5–15.5)
WBC: 6.8 10*3/uL (ref 4.0–10.5)
nRBC: 0 % (ref 0.0–0.2)

## 2019-11-03 LAB — URINALYSIS, ROUTINE W REFLEX MICROSCOPIC
Bilirubin Urine: NEGATIVE
Glucose, UA: NEGATIVE mg/dL
Hgb urine dipstick: NEGATIVE
Ketones, ur: NEGATIVE mg/dL
Nitrite: NEGATIVE
Protein, ur: 30 mg/dL — AB
Specific Gravity, Urine: 1.032 — ABNORMAL HIGH (ref 1.005–1.030)
pH: 5 (ref 5.0–8.0)

## 2019-11-03 LAB — BASIC METABOLIC PANEL
Anion gap: 11 (ref 5–15)
BUN: 9 mg/dL (ref 6–20)
CO2: 22 mmol/L (ref 22–32)
Calcium: 9.8 mg/dL (ref 8.9–10.3)
Chloride: 106 mmol/L (ref 98–111)
Creatinine, Ser: 1.02 mg/dL — ABNORMAL HIGH (ref 0.44–1.00)
GFR calc Af Amer: >60 mL/min (ref >60)
GFR calc non Af Amer: >60 mL/min (ref >60)
Glucose, Bld: 78 mg/dL (ref 70–99)
Potassium: 3.6 mmol/L (ref 3.5–5.1)
Sodium: 139 mmol/L (ref 135–145)

## 2019-11-03 LAB — I-STAT BETA HCG BLOOD, ED (MC, WL, AP ONLY): I-stat hCG, quantitative: <5 m[IU]/mL (ref <5)

## 2019-11-03 MED ORDER — CYCLOBENZAPRINE HCL 10 MG PO TABS: 10.0000 mg | ORAL_TABLET | Freq: Two times a day (BID) | ORAL | 0 refills | Status: AC | PRN

## 2019-11-03 MED ORDER — IOHEXOL 350 MG/ML SOLN
75.0000 mL | Freq: Once | INTRAVENOUS | Status: AC | PRN
  Administered 2019-11-03: 75 mL via INTRAVENOUS

## 2019-11-03 MED ORDER — HYDROCODONE-ACETAMINOPHEN 5-325 MG PO TABS
1.0000 | ORAL_TABLET | Freq: Once | ORAL | Status: AC
  Administered 2019-11-03: 1 via ORAL
  Filled 2019-11-03: qty 1

## 2019-11-03 NOTE — ED Triage Notes (Signed)
Pt states she broke her neck a few years ago, started working 2 days ago, noticing more pain in the neck and dizziness. Pt also reports that when she broke her neck she was told she had a dissection of an artery in her neck. Pt a/ox4, speech clear, moves all limbs equally. Denies any visual changes at this time.
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
## 2019-11-03 NOTE — Discharge Instructions (Addendum)
You were seen today for a neck injury with pain and dizziness. Your imaging shows your chronic left vertebral artery occlusion but fortunately there are no acute findings. It is important you follow up with neurosurgery. We have referred you to a new one since your old one was in [place]. We will give you medications to help with relaxing your neck as your probably have sprained it lifting. Please avoid heavy lifting and strenuous activity, especially those that involve the neck. You can take motrin along with the muscle relaxant that we are prescribing. Thank you for allowing me to care for you today. Please return to the emergency department if you have new or worsening symptoms. Take your medications as instructed.

## 2019-11-03 NOTE — ED Notes (Signed)
Pt stepped outside to warm up.   

## 2019-11-05 LAB — URINE CULTURE: Culture: <10000 — AB

## 2020-01-20 ENCOUNTER — Other Ambulatory Visit: Payer: Self-pay

## 2020-01-20 ENCOUNTER — Encounter (HOSPITAL_COMMUNITY): Payer: Self-pay | Admitting: Emergency Medicine

## 2020-01-20 ENCOUNTER — Emergency Department (HOSPITAL_COMMUNITY)
Admission: EM | Admit: 2020-01-20 | Discharge: 2020-01-21 | Disposition: A | Discharge status: Home / Self Care | Payer: Self-pay | Attending: Emergency Medicine | Admitting: Emergency Medicine

## 2020-01-20 DIAGNOSIS — E119 Type 2 diabetes mellitus without complications: Secondary | ICD-10-CM | POA: Insufficient documentation

## 2020-01-20 DIAGNOSIS — Z79899 Other long term (current) drug therapy: Secondary | ICD-10-CM | POA: Insufficient documentation

## 2020-01-20 DIAGNOSIS — M542 Cervicalgia: Secondary | ICD-10-CM | POA: Insufficient documentation

## 2020-01-20 DIAGNOSIS — I10 Essential (primary) hypertension: Secondary | ICD-10-CM | POA: Insufficient documentation

## 2020-01-20 DIAGNOSIS — G8929 Other chronic pain: Secondary | ICD-10-CM | POA: Insufficient documentation

## 2020-01-20 DIAGNOSIS — J45909 Unspecified asthma, uncomplicated: Secondary | ICD-10-CM | POA: Insufficient documentation

## 2020-01-20 NOTE — ED Triage Notes (Signed)
Pt c/o neck pain after starting a new job. Reports hx of neck problems, requesting a work note.

## 2020-01-21 NOTE — ED Notes (Signed)
Pt given work note by provider stating hx of c-spine fractures. Told to call medical records and/or use MyChart for any specific info about previous visits and referred to neurosurgery for follow-up
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
## 2020-01-30 ENCOUNTER — Emergency Department (HOSPITAL_COMMUNITY)
Admission: EM | Admit: 2020-01-30 | Discharge: 2020-02-11 | Disposition: E | Payer: Self-pay | Attending: Emergency Medicine | Admitting: Emergency Medicine

## 2020-01-30 ENCOUNTER — Emergency Department (HOSPITAL_COMMUNITY): Payer: Self-pay

## 2020-01-30 DIAGNOSIS — W3400XA Accidental discharge from unspecified firearms or gun, initial encounter: Secondary | ICD-10-CM | POA: Insufficient documentation

## 2020-01-30 DIAGNOSIS — S21349A Puncture wound with foreign body of unspecified front wall of thorax with penetration into thoracic cavity, initial encounter: Secondary | ICD-10-CM | POA: Insufficient documentation

## 2020-01-30 DIAGNOSIS — S81801A Unspecified open wound, right lower leg, initial encounter: Secondary | ICD-10-CM | POA: Insufficient documentation

## 2020-01-30 DIAGNOSIS — S31639A Puncture wound without foreign body of abdominal wall, unspecified quadrant with penetration into peritoneal cavity, initial encounter: Secondary | ICD-10-CM | POA: Insufficient documentation

## 2020-01-30 DIAGNOSIS — S80812A Abrasion, left lower leg, initial encounter: Secondary | ICD-10-CM | POA: Insufficient documentation

## 2020-01-30 DIAGNOSIS — T07XXXA Unspecified multiple injuries, initial encounter: Secondary | ICD-10-CM

## 2020-01-30 DIAGNOSIS — Y92410 Unspecified street and highway as the place of occurrence of the external cause: Secondary | ICD-10-CM | POA: Insufficient documentation

## 2020-01-30 LAB — BPAM FFP
Blood Product Expiration Date: 202109202359
Blood Product Expiration Date: 202109202359
Blood Product Expiration Date: 202109262359
Blood Product Expiration Date: 202109262359
Blood Product Expiration Date: 202110062359
Blood Product Expiration Date: 202110062359
ISSUE DATE / TIME: 202109191505
ISSUE DATE / TIME: 202109191505
ISSUE DATE / TIME: 202109191507
ISSUE DATE / TIME: 202109191507
Unit Type and Rh: 600
Unit Type and Rh: 6200
Unit Type and Rh: 6200
Unit Type and Rh: 6200
Unit Type and Rh: 6200
Unit Type and Rh: 6200

## 2020-01-30 LAB — PREPARE FRESH FROZEN PLASMA
Unit division: 0
Unit division: 0
Unit division: 0
Unit division: 0
Unit division: 0

## 2020-01-30 LAB — TYPE AND SCREEN
Unit division: 0
Unit division: 0
Unit division: 0
Unit division: 0
Unit division: 0
Unit division: 0

## 2020-01-30 LAB — BPAM RBC
Blood Product Expiration Date: 202109212359
Blood Product Expiration Date: 202109212359
Blood Product Expiration Date: 202109212359
Blood Product Expiration Date: 202109222359
Blood Product Expiration Date: 202110212359
Blood Product Expiration Date: 202110212359
ISSUE DATE / TIME: 202109191505
ISSUE DATE / TIME: 202109191505
ISSUE DATE / TIME: 202109191507
ISSUE DATE / TIME: 202109191507
ISSUE DATE / TIME: 202109191511
ISSUE DATE / TIME: 202109191511
Unit Type and Rh: 5100
Unit Type and Rh: 5100
Unit Type and Rh: 9500
Unit Type and Rh: 9500
Unit Type and Rh: 9500
Unit Type and Rh: 9500

## 2020-01-30 LAB — PREPARE PLATELET PHERESIS: Unit division: 0

## 2020-01-30 LAB — BPAM PLATELET PHERESIS
Blood Product Expiration Date: 202109202359
Unit Type and Rh: 6200

## 2020-01-30 MED ORDER — ETOMIDATE 2 MG/ML IV SOLN
INTRAVENOUS | Status: AC | PRN
  Administered 2020-01-30: 20 mg via INTRAVENOUS

## 2020-01-30 MED ORDER — SUCCINYLCHOLINE CHLORIDE 20 MG/ML IJ SOLN
INTRAMUSCULAR | Status: AC | PRN
  Administered 2020-01-30: 120 mg via INTRAVENOUS

## 2020-01-30 MED ORDER — EPINEPHRINE 1 MG/10ML IJ SOSY
PREFILLED_SYRINGE | INTRAMUSCULAR | Status: AC | PRN
  Administered 2020-01-30 (×4): 1 mg via INTRAVENOUS

## 2020-01-30 MED ORDER — FENTANYL CITRATE (PF) 100 MCG/2ML IJ SOLN
INTRAMUSCULAR | Status: DC
Start: 2020-01-30 — End: 2020-01-30
  Filled 2020-01-30: qty 2

## 2020-01-30 MED FILL — Medication: Qty: 1 | Status: AC

## 2020-02-11 NOTE — ED Notes (Signed)
CPR ongoing
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
## 2020-02-11 NOTE — ED Notes (Signed)
All efforts discontinued-1510
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
## 2020-02-11 NOTE — ED Notes (Signed)
No Blood drawn
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
## 2020-02-11 NOTE — ED Notes (Signed)
Vfib shocked at 200J

## 2020-02-11 NOTE — ED Notes (Signed)
Patient V-FIB/ shocked at 120J
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
## 2020-02-11 DEATH — deceased
